# Patient Record
Sex: Female | Born: 1955 | Race: White | Hispanic: No | Marital: Single | Smoking: Never smoker
Health system: Southern US, Community
[De-identification: ages and names within clinical notes are randomized; demographics above are authoritative.]

## PROBLEM LIST (undated history)

## (undated) DIAGNOSIS — E119 Type 2 diabetes mellitus without complications: Secondary | ICD-10-CM

## (undated) DIAGNOSIS — K219 Gastro-esophageal reflux disease without esophagitis: Secondary | ICD-10-CM

## (undated) DIAGNOSIS — Z87442 Personal history of urinary calculi: Secondary | ICD-10-CM

## (undated) DIAGNOSIS — M1990 Unspecified osteoarthritis, unspecified site: Secondary | ICD-10-CM

## (undated) DIAGNOSIS — R011 Cardiac murmur, unspecified: Secondary | ICD-10-CM

## (undated) DIAGNOSIS — I10 Essential (primary) hypertension: Secondary | ICD-10-CM

## (undated) DIAGNOSIS — D649 Anemia, unspecified: Secondary | ICD-10-CM

## (undated) DIAGNOSIS — E7800 Pure hypercholesterolemia, unspecified: Secondary | ICD-10-CM

## (undated) HISTORY — DX: Pure hypercholesterolemia, unspecified: E78.00

## (undated) HISTORY — PX: TONSILLECTOMY: SUR1361

---

## 1999-05-15 HISTORY — PX: WISDOM TOOTH EXTRACTION: SHX21

## 2001-07-15 ENCOUNTER — Other Ambulatory Visit: Admission: RE | Admit: 2001-07-15 | Discharge: 2001-07-15 | Payer: Self-pay | Admitting: Obstetrics and Gynecology

## 2001-07-28 ENCOUNTER — Encounter: Payer: Self-pay | Admitting: Obstetrics and Gynecology

## 2001-07-28 ENCOUNTER — Ambulatory Visit (HOSPITAL_COMMUNITY): Admission: RE | Admit: 2001-07-28 | Discharge: 2001-07-28 | Payer: Self-pay | Admitting: Obstetrics and Gynecology

## 2003-12-08 ENCOUNTER — Ambulatory Visit (HOSPITAL_COMMUNITY): Admission: RE | Admit: 2003-12-08 | Discharge: 2003-12-08 | Payer: Self-pay | Admitting: Obstetrics and Gynecology

## 2004-10-19 ENCOUNTER — Emergency Department (HOSPITAL_COMMUNITY): Admission: EM | Admit: 2004-10-19 | Discharge: 2004-10-19 | Payer: Self-pay | Admitting: Emergency Medicine

## 2004-11-08 ENCOUNTER — Ambulatory Visit (HOSPITAL_COMMUNITY): Admission: RE | Admit: 2004-11-08 | Discharge: 2004-11-08 | Payer: Self-pay | Admitting: Urology

## 2005-07-02 ENCOUNTER — Ambulatory Visit (HOSPITAL_COMMUNITY): Admission: RE | Admit: 2005-07-02 | Discharge: 2005-07-02 | Payer: Self-pay | Admitting: Obstetrics and Gynecology

## 2006-07-22 ENCOUNTER — Ambulatory Visit (HOSPITAL_COMMUNITY): Admission: RE | Admit: 2006-07-22 | Discharge: 2006-07-22 | Payer: Self-pay | Admitting: Family Medicine

## 2006-07-30 ENCOUNTER — Ambulatory Visit (HOSPITAL_COMMUNITY): Admission: RE | Admit: 2006-07-30 | Discharge: 2006-07-30 | Payer: Self-pay | Admitting: Otolaryngology

## 2007-11-19 ENCOUNTER — Ambulatory Visit (HOSPITAL_COMMUNITY): Admission: RE | Admit: 2007-11-19 | Discharge: 2007-11-19 | Payer: Self-pay | Admitting: Obstetrics and Gynecology

## 2007-11-20 ENCOUNTER — Ambulatory Visit: Payer: Self-pay | Admitting: Internal Medicine

## 2007-11-20 DIAGNOSIS — Z8669 Personal history of other diseases of the nervous system and sense organs: Secondary | ICD-10-CM | POA: Insufficient documentation

## 2007-11-20 DIAGNOSIS — K219 Gastro-esophageal reflux disease without esophagitis: Secondary | ICD-10-CM | POA: Insufficient documentation

## 2007-11-20 DIAGNOSIS — J309 Allergic rhinitis, unspecified: Secondary | ICD-10-CM | POA: Insufficient documentation

## 2007-11-21 ENCOUNTER — Encounter (INDEPENDENT_AMBULATORY_CARE_PROVIDER_SITE_OTHER): Payer: Self-pay | Admitting: Internal Medicine

## 2007-11-25 ENCOUNTER — Ambulatory Visit: Payer: Self-pay | Admitting: Internal Medicine

## 2007-11-25 ENCOUNTER — Other Ambulatory Visit: Admission: RE | Admit: 2007-11-25 | Discharge: 2007-11-25 | Payer: Self-pay | Admitting: Obstetrics and Gynecology

## 2007-11-25 DIAGNOSIS — I10 Essential (primary) hypertension: Secondary | ICD-10-CM | POA: Insufficient documentation

## 2007-11-27 ENCOUNTER — Encounter (INDEPENDENT_AMBULATORY_CARE_PROVIDER_SITE_OTHER): Payer: Self-pay | Admitting: Internal Medicine

## 2007-12-01 ENCOUNTER — Ambulatory Visit (HOSPITAL_COMMUNITY): Admission: RE | Admit: 2007-12-01 | Discharge: 2007-12-01 | Payer: Self-pay | Admitting: Obstetrics and Gynecology

## 2007-12-20 ENCOUNTER — Encounter (INDEPENDENT_AMBULATORY_CARE_PROVIDER_SITE_OTHER): Payer: Self-pay | Admitting: Internal Medicine

## 2007-12-26 LAB — CONVERTED CEMR LAB
BUN: 19 mg/dL (ref 6–23)
Basophils Absolute: 0.1 10*3/uL (ref 0.0–0.1)
Basophils Relative: 1 % (ref 0–1)
CO2: 25 meq/L (ref 19–32)
Calcium: 9.2 mg/dL (ref 8.4–10.5)
Chloride: 102 meq/L (ref 96–112)
Cholesterol: 193 mg/dL (ref 0–200)
Creatinine, Ser: 0.7 mg/dL (ref 0.40–1.20)
Eosinophils Absolute: 0.1 10*3/uL (ref 0.0–0.7)
Eosinophils Relative: 2 % (ref 0–5)
Glucose, Bld: 159 mg/dL — ABNORMAL HIGH (ref 70–99)
HCT: 44.1 % (ref 36.0–46.0)
HDL: 53 mg/dL (ref >39)
Hemoglobin: 14.4 g/dL (ref 12.0–15.0)
LDL Cholesterol: 119 mg/dL — ABNORMAL HIGH (ref 0–99)
Lymphocytes Relative: 38 % (ref 12–46)
Lymphs Abs: 3 10*3/uL (ref 0.7–4.0)
MCHC: 32.7 g/dL (ref 30.0–36.0)
MCV: 91.1 fL (ref 78.0–100.0)
Monocytes Absolute: 0.6 10*3/uL (ref 0.1–1.0)
Monocytes Relative: 8 % (ref 3–12)
Neutro Abs: 4.2 10*3/uL (ref 1.7–7.7)
Neutrophils Relative %: 53 % (ref 43–77)
Platelets: 247 10*3/uL (ref 150–400)
Potassium: 4.3 meq/L (ref 3.5–5.3)
RBC: 4.84 M/uL (ref 3.87–5.11)
RDW: 12.6 % (ref 11.5–15.5)
Sodium: 138 meq/L (ref 135–145)
TSH: 2.132 microintl units/mL (ref 0.350–4.50)
Total CHOL/HDL Ratio: 3.6
Triglycerides: 105 mg/dL (ref <150)
VLDL: 21 mg/dL (ref 0–40)
WBC: 8 10*3/uL (ref 4.0–10.5)

## 2008-01-06 ENCOUNTER — Telehealth (INDEPENDENT_AMBULATORY_CARE_PROVIDER_SITE_OTHER): Payer: Self-pay | Admitting: Internal Medicine

## 2008-02-25 ENCOUNTER — Encounter (INDEPENDENT_AMBULATORY_CARE_PROVIDER_SITE_OTHER): Payer: Self-pay | Admitting: Internal Medicine

## 2008-05-14 HISTORY — PX: COLONOSCOPY: SHX174

## 2008-06-16 ENCOUNTER — Ambulatory Visit: Payer: Self-pay | Admitting: Internal Medicine

## 2008-06-17 ENCOUNTER — Encounter (INDEPENDENT_AMBULATORY_CARE_PROVIDER_SITE_OTHER): Payer: Self-pay | Admitting: Internal Medicine

## 2008-06-21 LAB — CONVERTED CEMR LAB
BUN: 14 mg/dL (ref 6–23)
CO2: 18 meq/L — ABNORMAL LOW (ref 19–32)
Calcium: 9.5 mg/dL (ref 8.4–10.5)
Chloride: 102 meq/L (ref 96–112)
Creatinine, Ser: 0.64 mg/dL (ref 0.40–1.20)
Glucose, Bld: 206 mg/dL — ABNORMAL HIGH (ref 70–99)
Potassium: 4.1 meq/L (ref 3.5–5.3)
Sodium: 138 meq/L (ref 135–145)

## 2008-06-25 ENCOUNTER — Ambulatory Visit: Payer: Self-pay | Admitting: Internal Medicine

## 2008-06-25 LAB — CONVERTED CEMR LAB
Blood Glucose, Fingerstick: 293
Hgb A1c MFr Bld: 10.3 %

## 2008-06-28 ENCOUNTER — Encounter (INDEPENDENT_AMBULATORY_CARE_PROVIDER_SITE_OTHER): Payer: Self-pay | Admitting: Internal Medicine

## 2008-08-07 ENCOUNTER — Emergency Department (HOSPITAL_COMMUNITY): Admission: EM | Admit: 2008-08-07 | Discharge: 2008-08-07 | Payer: Self-pay | Admitting: Emergency Medicine

## 2008-09-22 ENCOUNTER — Ambulatory Visit: Payer: Self-pay | Admitting: Internal Medicine

## 2008-09-22 LAB — CONVERTED CEMR LAB
Blood Glucose, Fingerstick: 106
Hgb A1c MFr Bld: 7.4 %

## 2008-09-23 ENCOUNTER — Encounter (INDEPENDENT_AMBULATORY_CARE_PROVIDER_SITE_OTHER): Payer: Self-pay | Admitting: Internal Medicine

## 2008-09-23 LAB — CONVERTED CEMR LAB
Creatinine, Urine: 174.2 mg/dL
Microalb Creat Ratio: 6.1 mg/g (ref 0.0–30.0)
Microalb, Ur: 1.06 mg/dL (ref 0.00–1.89)

## 2008-12-29 ENCOUNTER — Ambulatory Visit: Payer: Self-pay | Admitting: Internal Medicine

## 2008-12-29 DIAGNOSIS — E119 Type 2 diabetes mellitus without complications: Secondary | ICD-10-CM | POA: Insufficient documentation

## 2008-12-29 LAB — CONVERTED CEMR LAB: Hgb A1c MFr Bld: 6.7 %

## 2009-01-08 ENCOUNTER — Encounter (INDEPENDENT_AMBULATORY_CARE_PROVIDER_SITE_OTHER): Payer: Self-pay | Admitting: Internal Medicine

## 2009-01-11 ENCOUNTER — Encounter: Payer: Self-pay | Admitting: Internal Medicine

## 2009-01-12 LAB — CONVERTED CEMR LAB
ALT: 16 units/L (ref 0–35)
AST: 13 units/L (ref 0–37)
Albumin: 4.4 g/dL (ref 3.5–5.2)
Alkaline Phosphatase: 60 units/L (ref 39–117)
BUN: 14 mg/dL (ref 6–23)
CO2: 25 meq/L (ref 19–32)
Calcium: 9.3 mg/dL (ref 8.4–10.5)
Chloride: 102 meq/L (ref 96–112)
Cholesterol: 174 mg/dL (ref 0–200)
Creatinine, Ser: 0.64 mg/dL (ref 0.40–1.20)
Glucose, Bld: 137 mg/dL — ABNORMAL HIGH (ref 70–99)
HDL: 38 mg/dL — ABNORMAL LOW (ref >39)
LDL Cholesterol: 112 mg/dL — ABNORMAL HIGH (ref 0–99)
Potassium: 4.8 meq/L (ref 3.5–5.3)
Sodium: 138 meq/L (ref 135–145)
Total Bilirubin: 0.5 mg/dL (ref 0.3–1.2)
Total CHOL/HDL Ratio: 4.6
Total Protein: 6.9 g/dL (ref 6.0–8.3)
Triglycerides: 119 mg/dL (ref <150)
VLDL: 24 mg/dL (ref 0–40)

## 2009-01-13 ENCOUNTER — Telehealth (INDEPENDENT_AMBULATORY_CARE_PROVIDER_SITE_OTHER): Payer: Self-pay | Admitting: Internal Medicine

## 2009-01-26 ENCOUNTER — Ambulatory Visit (HOSPITAL_COMMUNITY): Admission: RE | Admit: 2009-01-26 | Discharge: 2009-01-26 | Payer: Self-pay | Admitting: Internal Medicine

## 2009-01-26 ENCOUNTER — Ambulatory Visit: Payer: Self-pay | Admitting: Internal Medicine

## 2009-01-26 DIAGNOSIS — M79609 Pain in unspecified limb: Secondary | ICD-10-CM | POA: Insufficient documentation

## 2009-02-21 ENCOUNTER — Ambulatory Visit (HOSPITAL_COMMUNITY): Admission: RE | Admit: 2009-02-21 | Discharge: 2009-02-21 | Payer: Self-pay | Admitting: Internal Medicine

## 2009-02-21 ENCOUNTER — Ambulatory Visit: Payer: Self-pay | Admitting: Internal Medicine

## 2009-02-21 ENCOUNTER — Encounter: Payer: Self-pay | Admitting: Internal Medicine

## 2009-02-24 ENCOUNTER — Ambulatory Visit (HOSPITAL_COMMUNITY): Admission: RE | Admit: 2009-02-24 | Discharge: 2009-02-24 | Payer: Self-pay | Admitting: Internal Medicine

## 2009-10-12 ENCOUNTER — Ambulatory Visit (HOSPITAL_COMMUNITY): Admission: RE | Admit: 2009-10-12 | Discharge: 2009-10-12 | Payer: Self-pay | Admitting: Internal Medicine

## 2010-06-13 NOTE — Letter (Signed)
Summary: Epworth Scale  Epworth Scale   Imported By: Lutricia Horsfall 11/27/2007 14:03:27  _____________________________________________________________________  External Attachment:    Type:   Image     Comment:   External Document

## 2010-06-13 NOTE — Assessment & Plan Note (Signed)
**Note De-Identified Mary Holden Obfuscation** Summary: LT GREAT TOE PAIN   Vital Signs:  Patient profile:   55 year old female Weight:      212.75 pounds O2 Sat:      96 % on Room air Pulse rate:   74 / minute Pulse rhythm:   regular BP sitting:   120 / 70  (right arm)  Vitals Entered By: Micah Flesher, LPN (January 26, 2009 3:13 PM) CC: L toe pain   CC:  L toe pain.  History of Present Illness: here with redness and pain in left toe.  She says the redness got better after the doxycycline but recurred and the clindamycin didn't help it much.  She has noticed increased pain and swelling.  Allergies: 1)  ! * Sulpha 2)  Codeine 3)  Phenergan  Past History:  Past Medical History: Reviewed history from 06/25/2008 and no changes required. Allergic rhinitis GERD carpal tunnel syndrome Hypertension NIDDM  Physical Exam  General:  Obese, in no acute distress. Alert and oriented X 3.  Extremities:  erythema with increased warmth left great toe medially from nail proximally to above PIP joint.  there is some swelling over the joint medially.     Impression & Recommendations:  Problem # 1:  TOE PAIN (ICD-729.5) The extent of this is much greater than before and does not appear like a paronychia, but possibly an inflammatory arthropathy.  Will start nabumetone two times a day and get x-ray for further evaluation.  Orders: Diagnostic X-Ray/Fluoroscopy (Diagnostic X-Ray/Flu)  Complete Medication List: 1)  Claritin 10 Mg Tabs (Loratadine) 2)  Adult Aspirin Low Strength 81 Mg Tbdp (Aspirin) 3)  Lisinopril 20 Mg Tabs (Lisinopril) .Marland Kitchen.. 1 by mouth once daily 4)  Pravastatin Sodium 20 Mg Tabs (Pravastatin sodium) .Marland Kitchen.. 1 by mouth once daily 5)  Nabumetone 750 Mg Tabs (Nabumetone) .Marland Kitchen.. 1 by mouth two times a day  Patient Instructions: 1)  You will be called with the results when they are available. Prescriptions: NABUMETONE 750 MG TABS (NABUMETONE) 1 by mouth two times a day  #60 x 0   Entered and Authorized by:    Erle Crocker MD   Signed by:   Erle Crocker MD on 01/26/2009   Method used:   Electronically to        [facility]  [address] (retail)       [address]       [place], [place]  [address]       Ph: [phone]       Fax: [phone]   RxID:   [id number]

## 2010-06-13 NOTE — Assessment & Plan Note (Signed)
Summary: HTN   Vital Signs:  Patient Profile:   55 Years Old Female Height:     63 inches O2 Sat:      96 % O2 treatment:    Room Air Pulse rate:   87 / minute Resp:     9 per minute BP sitting:   136 / 84  (right arm)  Vitals Entered By: Lutricia Horsfall LPN (June 16, 2008 3:41 PM)                 Chief Complaint:  med refills.  History of Present Illness: Here for routine follow up.  She says she has been tolerating the lisinopril and her blood pressure has been running in the 100s/70s.  She is without complaints and says she needs a refill of the lisinopril.    Current Allergies: ! * SULPHA CODEINE PHENERGAN  Past Medical History:    Reviewed history from 11/25/2007 and no changes required:       Allergic rhinitis       GERD       carpal tunnel syndrome       Hypertension      Physical Exam  General:     Obese, in no acute distress. Alert and oriented X 3.     Impression & Recommendations:  Problem # 1:  HYPERTENSION (ICD-401.9) Reports excellent blood pressure control.  Continue lisinopril.  BMP today.  Discussed with her the importance of follow up q 6 months as the lisinopril can affect her kidneys and potassium and we want to monitor the medication. Her updated medication list for this problem includes:    Lisinopril 20 Mg Tabs (Lisinopril) .Marland Kitchen... 1 by mouth once daily  Orders: T-Basic Metabolic Panel [phone]) Venipuncture ([address])   Complete Medication List: 1)  Triamcinolone Acetonide 0.1 % Crea (Triamcinolone acetonide) .... Apply to affected area two times a day 2)  Claritin 10 Mg Tabs (Loratadine) 3)  Adult Aspirin Low Strength 81 Mg Tbdp (Aspirin) 4)  Lisinopril 20 Mg Tabs (Lisinopril) .Marland Kitchen.. 1 by mouth once daily   Patient Instructions: 1)  The patient will be called with the results when they are available.   Prescriptions: LISINOPRIL 20 MG  TABS (LISINOPRIL) 1 by mouth once daily  #30 x 5   Entered and Authorized by:    Erle Crocker MD   Signed by:   Erle Crocker MD on 06/16/2008   Method used:   Electronically to        [facility] (retail)       [address]       [place], [place]  [address]       Ph: [phone]       Fax: [phone]   RxID:   [id number]

## 2010-06-13 NOTE — Letter (Signed)
Summary: Work [facility]  [address]   [place], [place] [address]   Phone: [phone]  Fax: [phone]    Today's Date: November 20, 2007  Name of Patient: Mary Holden  The above named patient had a medical visit today at:   8:30 am.  Please take this into consideration when reviewing the time away from work/school.    Special Instructions:  [ x ] None  [  ] To be off the remainder of today, returning to the normal work / school schedule tomorrow.  [  ] To be off until the next scheduled appointment on ______________________.  [  ] Other ________________________________________________________________ ________________________________________________________________________   Sincerely yours,   Erle Crocker MD

## 2010-06-13 NOTE — Assessment & Plan Note (Signed)
Summary: Establish care   Vital Signs:  Patient Profile:   55 Years Old Female Height:     63 inches Weight:      228.75 pounds O2 Sat:      96 % O2 treatment:    Room Air Temp:     99.6 degrees F tympanic Pulse rate:   92 / minute Resp:     10 per minute BP sitting:   162 / 92  (left arm)  Vitals Entered By: Lutricia Horsfall (November 25, 2007 2:02 PM)                 Chief Complaint:  new patient.  History of Present Illness: 55 year old woman here with SO to establish care.  She went fishing this past weekend and went to 3 different ponds and the same evening she noted small black spots on her ankles, L>R.  The next day she noted red, itchy spots, mostly around her ankles but also up her legs, L>R.  It all started 4 days ago and it has progressed and she has some lesions on her back, abdomen now.    She had been going to [facility] due to a cough.  SHe says she gets a cough about every year.  It had been a nagging, aggrevating cough, that had been so bad that she vomited at times.  She also started having trouble with her breathing last year when the cough started.  Both years the cough went away but her trouble breathing did not.  It occurs with walking real fast or if she walks up hill.  She doesn't call it shortness of breath but "short winded."  No chest pains.  Cough has been non productive.  She does snore and does have trouble with daytime fatigue.  Her SO says she does sound like she stops breathing at night sometimes.    She has an appointment with Dr. Emelda Fear this afternoon for her pap smear.  Her last mammogram was last week.  She has never had colon cancer screening.    Her blood pressures when she checks them at [facility] is 150/90s and has been for 1-2 years.  She had been on HCTZ at one time for her blood pressure and then her medication was changed to something that was too expensive so she stopped it.      Current Allergies: ! * SULPHA CODEINE PHENERGAN  Past  Medical History:    Allergic rhinitis    GERD    carpal tunnel syndrome    Hypertension   Family History:    father-deceased-69-heart, lung cancer    mother-deceased-58-DM, breast cancer, throat cancer    brother-deceased-60-throat cancer, [place] vet--PTSD, schizophrenia    Review of Systems  General      Denies chills, fever, and sweats.  Eyes      Denies blurring and double vision.  ENT      Complains of nasal congestion.      Denies earache and sore throat.  CV      Denies chest pain or discomfort and palpitations.  Resp      See HPI  GI      Denies constipation, diarrhea, and nausea.  GU      Denies dysuria and incontinence.  MS      Denies joint pain and low back pain.  Derm      See HPI  Neuro      Denies headaches, numbness, and seizures.  Psych  Denies anxiety and depression.   Physical Exam  General:     Obese, in no acute distress. Alert and oriented X 3.  Eyes:     EOMI, PERRL, sclera anicteric  Ears:     Tympanic membranes clear.  Ear canals without erythema.  Nose:     moderate Erythema and swelling with clear drainage.  Mouth:     No eyrthema or exudate.  Neck:     No lymphadenopathy, JVD, thyromegaly or carotid bruits.  Lungs:     Clear to auscultation bilaterally with good air movement, normal expansion.  Heart:     Normal S1 and S2. No murmurs or extracardiac sounds. PMI is nondisplaced.  Abdomen:     Soft, nontender, nondistended, + bowel sounds, no organomegaly.  Extremities:     No edema, calf tenderness or swelling.  Skin:     erythematous papules the coalesce on left ankle.  On legs there are multiple individual erythematous papules.  Ther are a few scattered papules on the trunk.   Additional Exam:     Epworth score: 13    Impression & Recommendations:  Problem # 1:  DYSPNEA (ICD-786.05) Spirometry normal.  I suspect this is multifactorial, with obesity, untreated HTN with possible LV strain,  possible sleep apnea, all contributing.  We are going to work on getting her blood pressure under better control and get a sleep study.  We discussed the need for weight loss.  We will check a CBC also.  If all of this does not result in a clear diagnosis and improvement in dyspnea, she will need a cardiac evaluation and may need to to peak flows at home.  Orders: T-CBC w/Diff ([id number]) Spirometry w/Graph ([address]) Split Night (Split Night)   Problem # 2:  OBESITY, MORBID (ICD-278.01) Discussed the need for weight loss especially given her family history of DM.  She is going to come back fasting for her labs, in at least 2 weeks when the steroid will be out of her system.  Orders: T-Basic Metabolic Panel [id number]) T-Lipid Profile [phone]) T-TSH ([phone])   Problem # 3:  HYPERTENSION (ICD-401.9) Her blood pressure needs to be treated and we are going to start a moderate dose of lisinopril once daily.  I have asked her to check her blood pressure 1-2 times per week for the next few weeks and we can see what the lisinipril is doing.   Micromedex handout given on ACE-I.   Her updated medication list for this problem includes:    Lisinopril 20 Mg Tabs (Lisinopril) .Marland Kitchen... 1 by mouth once daily  Orders: Split Night (Split Night)   Problem # 4:  INSECT BITE (ICD-919.4) The rash appears most like insect bites.   Orders: Dexamethasone Sodium Phosphate 1mg  (J1100) Depo- Medrol 80mg  (J1040) Admin of Therapeutic Inj  intramuscular or subcutaneous (28413)   Problem # 5:  Preventive Health Care (ICD-V70.0) We discussed options for colon cancer screening and she is going to consider them.    Complete Medication List: 1)  Triamcinolone Acetonide 0.1 % Crea (Triamcinolone acetonide) .... Apply to affected area two times a day 2)  Claritin 10 Mg Tabs (Loratadine) 3)  Adult Aspirin Low Strength 81 Mg Tbdp (Aspirin) 4)  Lisinopril 20 Mg Tabs (Lisinopril) .Marland Kitchen.. 1 by mouth once  daily  Spirometry completed per MD order.  Results to MD for review.  Sherrie Gardner  November 25, 2007 3:10 PM   Prescriptions: LISINOPRIL 20 MG  TABS (LISINOPRIL) 1 by mouth once  daily  #30 x 5   Entered and Authorized by:   Erle Crocker MD   Signed by:   Erle Crocker MD on 11/26/2007   Method used:   Faxed to ...       [facility] [address] [facility]       [address]       [place], [place]  [address]       Ph: [phone]       Fax: [phone]   RxID:   [id number]  ]  Medication Administration  Injection # 1:    Medication: Dexamethasone Sodium Phosphate 1mg     Diagnosis: INSECT BITE (ICD-919.4)    Route: IM    Site: R deltoid    Exp Date: 2/11    Lot #: [id number]    Mfr: [employer]ent    Comments: 4mg  IM per MD order    Patient tolerated injection without complications    Given by: Lutricia Horsfall (November 25, 2007 3:42 PM)  Injection # 2:    Medication: Depo- Medrol 80mg     Diagnosis: INSECT BITE (ICD-919.4)    Route: IM    Site: R deltoid    Exp Date: 8/11    Lot #: [id number]    Mfr: [employer]    Patient tolerated injection without complications    Given by: Lutricia Horsfall (November 25, 2007 3:42 PM)  Orders Added: 1)  T-CBC w/Diff [[id number]] 2)  T-Basic Metabolic Panel [id number] 3)  T-Lipid Profile [[address]-63016] 4)  T-TSH [[id number]] 5)  Dexamethasone Sodium Phosphate 1mg  [J1100] 6)  Depo- Medrol 80mg  [J1040] 7)  Admin of Therapeutic Inj  intramuscular or subcutaneous [[address]] 8)  Est. Patient Level V [99215] 9)  Spirometry w/Graph [94010] 10)  Split Night [Split Night]     Pulmonary Function Test Date: 11/25/2007 Height (in.): 63 Gender: Female  Pre-Spirometry FVC    Value: 3.45 L/min   Pred: 3.00 L/min     % Pred: 115 % FEV1    Value: 2.86 L     Pred: 2.46 L     % Pred: 116 % FEV1/FVC  Value: 83 %     Pred: 82 %     % Pred: 101 % FEF 25-75  Value: 3.53 L/min   Pred: 2.73 L/min     % Pred: 129 %  Appended Document: Establish  care Referral for sleep study faxed.  Appended Document: Establish care Appt scheduled for 01/09/08.

## 2010-06-13 NOTE — Progress Notes (Signed)
Summary: Sleep Study Cancelled  Phone Note From Other Clinic   Summary of Call: LInda at [facility] Scheduling at [facility] states that pt called and advised her that a friend will be having a kidney transplant and that she will not be able to have sleep study as scheduled.  States that she will call back to reschedule. Initial call taken by: Lutricia Horsfall,  January 06, 2008 10:32 AM  Follow-up for Phone Call        Noted Follow-up by: Erle Crocker MD,  January 06, 2008 10:53 AM

## 2010-06-13 NOTE — Letter (Signed)
Summary: New patient H and P  New patient H and P   Imported By: Donneta Romberg 11/21/2007 17:05:35  _____________________________________________________________________  External Attachment:    Type:   Image     Comment:   External Document

## 2010-06-13 NOTE — Letter (Signed)
Summary: Work [facility]  [address]   [place], [place] [address]   Phone: [phone]  Fax: [phone]    Today's Date: November 25, 2007  Name of Patient: Mary Holden  The above named patient had a medical visit today at:  1330 pm.  Pt was in our office until 1540.  Please take this into consideration when reviewing the time away from work/school.    Special Instructions:  [ X ] None  [  ] To be off the remainder of today, returning to the normal work / school schedule tomorrow.  [  ] To be off until the next scheduled appointment on ______________________.  [  ] Other ________________________________________________________________ ________________________________________________________________________   Sincerely yours,   Lutricia Horsfall, LPN

## 2010-06-13 NOTE — Letter (Signed)
Summary: [facility] Yearly pap and physical  [facility] pap [facility] physical   Imported By: Curtis Sites 12/03/2007 16:01:31  _____________________________________________________________________  External Attachment:    Type:   Image     Comment:   External Document

## 2010-06-13 NOTE — Assessment & Plan Note (Signed)
Summary: DM/HTN   Vital Signs:  Patient profile:   55 year old female Weight:      219 pounds BMI:     38.93 O2 Sat:      99 % Pulse rate:   96 / minute BP sitting:   122 / 74  (right arm)  Vitals Entered By: Micah Flesher, LPN (Sep 22, 2008 4:02 PM)  Nutrition Counseling: Patient's BMI is greater than 25 and therefore counseled on weight management options. CC: DM F/U Is Patient Diabetic? Yes  CBG Result 106   CC:  DM F/U.  History of Present Illness: Patient presents today for routine follow up with her partner.  She has been working on diet and exercise but pulled the abductor muscles in her left leg and was out of commission for about 1 month from end of March to April.  Current Medications (verified): 1)  Claritin 10 Mg  Tabs (Loratadine) 2)  Adult Aspirin Low Strength 81 Mg  Tbdp (Aspirin) 3)  Lisinopril 20 Mg  Tabs (Lisinopril) .Marland Kitchen.. 1 By Mouth Once Daily  Allergies: 1)  ! * Sulpha 2)  Codeine 3)  Phenergan  Past History:  Past Medical History:    Reviewed history from 06/25/2008 and no changes required:    Allergic rhinitis    GERD    carpal tunnel syndrome    Hypertension    NIDDM  Physical Exam  General:  Obese, in no acute distress. Alert and oriented X 3.    Impression & Recommendations:  Problem # 1:  DIABETES MELLITUS, TYPE II, UNCONTROLLED (ICD-250.02) In 3 months she has lost 10 pounds and brought her HbA1c down 3%.  Although not at goal, she has made excellent progress and she will continue with her lifestyle modifications.  Urine was obtained today for a urinary microalbumin-creatinine ratio.  Her updated medication list for this problem includes:    Adult Aspirin Low Strength 81 Mg Tbdp (Aspirin)    Lisinopril 20 Mg Tabs (Lisinopril) .Marland Kitchen... 1 by mouth once daily  Orders: Capillary Blood Glucose/CBG (82948) Hemoglobin A1C (83036) T-Urine Microalbumin w/creat. ratio ([address] / [address])  Problem # 2:  HYPERTENSION (ICD-401.9) BP  excellent. Her updated medication list for this problem includes:    Lisinopril 20 Mg Tabs (Lisinopril) .Marland Kitchen... 1 by mouth once daily  Complete Medication List: 1)  Claritin 10 Mg Tabs (Loratadine) 2)  Adult Aspirin Low Strength 81 Mg Tbdp (Aspirin) 3)  Lisinopril 20 Mg Tabs (Lisinopril) .Marland Kitchen.. 1 by mouth once daily  Patient Instructions: 1)  Please schedule a follow-up appointment in 3 months.  Laboratory Results   Blood Tests   Date/Time Received: Sep 22, 2008 4:09 PM   HGBA1C: 7.4%   (Normal Range: Non-Diabetic - 3-6%   Control Diabetic - 6-8%) CBG Random:: 106mg /dL

## 2010-06-13 NOTE — Assessment & Plan Note (Signed)
Summary: DM/HTN/paronychia/mammogram & c-scope referral   Vital Signs:  Patient profile:   55 year old female Height:      63 inches Weight:      210 pounds O2 Sat:      97 % Pulse rate:   96 / minute BP sitting:   128 / 80  (left arm) Cuff size:   large  Vitals Entered By: Carlye Grippe (December 29, 2008 3:50 PM) CC: follow-up visit DM Is Patient Diabetic? Yes   CC:  follow-up visit DM.  History of Present Illness: Patient presents today for routine follow up with her partner.  They have modified their diet and she says she doesn't feel like she is doing without anything.  She is without complaints except for pain and redness of her left great toe for a few days.    Her last mammogram was in July of 2009 as was her last pap smear.  She has never had colon cancer screening.  Allergies: 1)  ! * Sulpha 2)  Codeine 3)  Phenergan  Past History:  Past Medical History: Reviewed history from 06/25/2008 and no changes required. Allergic rhinitis GERD carpal tunnel syndrome Hypertension NIDDM  Past Surgical History: Reviewed history from 11/20/2007 and no changes required. Denies surgical history  Family History: father-deceased-69-heart, lung cancer mother-deceased-58-DM, breast cancer, throat cancer brother-deceased-61-throat cancer, [place] vet--PTSD, schizophrenia  Social History: Reviewed history from 06/25/2008 and no changes required. Divorced lives with partner Never Smoked Alcohol use-yes-0-2 drinks, monthly or less Drug use-no  Review of Systems      See HPI  Physical Exam  General:  Obese, in no acute distress. Alert and oriented X 3.  Extremities:  erythema with increased warmth to left medial great toe around nail.   Impression & Recommendations:  Problem # 1:  DIABETES MELLITUS, CONTROLLED, WITHOUT COMPLICATIONS (ICD-250.00) She has done a great job with diet and weight loss and her HbA1c is perfect.  She will return for fasting labs.   Her  updated medication list for this problem includes:    Adult Aspirin Low Strength 81 Mg Tbdp (Aspirin)    Lisinopril 20 Mg Tabs (Lisinopril) .Marland Kitchen... 1 by mouth once daily  Problem # 2:  HYPERTENSION (ICD-401.9) BP looks good and she is due for labs. Her updated medication list for this problem includes:    Lisinopril 20 Mg Tabs (Lisinopril) .Marland Kitchen... 1 by mouth once daily  Problem # 3:  PARONYCHIA, LEFT GREAT TOE (ICD-681.11)  Her updated medication list for this problem includes:    Doxycycline Hyclate 100 Mg Tabs (Doxycycline hyclate) .Marland Kitchen... 1 by mouth two times a day  Problem # 4:  UNSPECIFIED BREAST SCREENING (ICD-V76.10) Mammogram scheduled 26 January 2009 Orders: Mammogram (Mammogram)  Problem # 5:  SCREENING, COLON CANCER (ICD-V76.51)  Discussed options for colon cancer screening and she is agreeable to referral for colonoscopyl  Orders: Gastroenterology Referral (GI)  Complete Medication List: 1)  Claritin 10 Mg Tabs (Loratadine) 2)  Adult Aspirin Low Strength 81 Mg Tbdp (Aspirin) 3)  Lisinopril 20 Mg Tabs (Lisinopril) .Marland Kitchen.. 1 by mouth once daily 4)  Doxycycline Hyclate 100 Mg Tabs (Doxycycline hyclate) .Marland Kitchen.. 1 by mouth two times a day  Other Orders: Hemoglobin A1C (87564) T-Comprehensive Metabolic Panel ([address]-18841) T-Lipid Profile (66063-01601)  Patient Instructions: 1)  You will be called with the results when they are available. Prescriptions: DOXYCYCLINE HYCLATE 100 MG TABS (DOXYCYCLINE HYCLATE) 1 by mouth two times a day  #14 x 0   Entered and  Authorized by:   Erle Crocker MD   Signed by:   Erle Crocker MD on 12/29/2008   Method used:   Electronically to        [facility] [address] (retail)       [address]       [place], [place]  [address]       Ph: [phone]       Fax: [phone]   RxID:   [id number] LISINOPRIL 20 MG  TABS (LISINOPRIL) 1 by mouth once daily  #30 Each x 5   Entered and Authorized by:   Erle Crocker MD    Signed by:   Erle Crocker MD on 12/29/2008   Method used:   Electronically to        [facility]   [address] [facility]ail[facility]       [address]       [place], [place]  [address]       Ph: [phone]       Fax: [phone]   RxID:   [id number]   Laboratory Results   Blood Tests   Date/Time Recieved: December 29, 2008 4:04 PM  Date/Time Reported: December 29, 2008 4:04 PM   HGBA1C: 6.7%   (Normal Range: Non-Diabetic - 3-6%   Control Diabetic - 6-8%)

## 2010-06-13 NOTE — Assessment & Plan Note (Signed)
Summary: contact dermatitis   Vital Signs:  Patient Profile:   55 Years Old Female O2 Sat:      94 % O2 treatment:    Room Air Pulse rate:   79 / minute Resp:     9 per minute BP sitting:   152 / 104  (right arm)  Vitals Entered By: Lutricia Horsfall (November 20, 2007 8:29 AM)                 Chief Complaint:  ?shingles.  History of Present Illness: 5 days of rash that is tender and a little itchy on her right posterior shoulder.  She did have chicken pox as a child. She has applied hydrocortisone 1% cream on one occasion but it didn't really help.  She thought it was a bug bite initially but she saw the nurse at work who told her it might be shingles because there was some   Has appointment to establish care next week.     Current Allergies: ! * SULPHA CODEINE PHENERGAN  Past Medical History:    Allergic rhinitis    ? HTN    GERD    carpal tunnel syndrome  Past Surgical History:    Denies surgical history   Family History:    father-deceased-69-heart, lung cancer    mother-deceased-58-DM, breast cancer, throat cancer  Social History:    Divorced lives with SO    Never Smoked    Alcohol use-yes-0-2 drinks, monthly or less    Drug use-no   Risk Factors:  Tobacco use:  never Drug use:  no Alcohol use:  yes    Physical Exam  General:     Obese, in no acute distress. Alert and oriented X 3.  Skin:     erythematous plaque, 10 cm x 7 cm, with scan tiny vesicles.      Impression & Recommendations:  Problem # 1:  CONTACT DERMATITIS (ICD-692.9) This looks more like a allergic skin reaction than shingles.  We will try topical steroids and see what it looks like next week. Her updated medication list for this problem includes:    Triamcinolone Acetonide 0.1 % Crea (Triamcinolone acetonide) .Marland Kitchen... Apply to affected area two times a day    Claritin 10 Mg Tabs (Loratadine)   Complete Medication List: 1)  Triamcinolone Acetonide 0.1 % Crea (Triamcinolone  acetonide) .... Apply to affected area two times a day 2)  Claritin 10 Mg Tabs (Loratadine) 3)  Adult Aspirin Low Strength 81 Mg Tbdp (Aspirin)  PAP Screening:  PAP Smear Results:    Date of Exam:  10/27/2006    Results:  Normal  Next PAP Due:    10/26/2008  Mammogram Screening:  Mammogram Results:    Date of Exam:  11/19/2007    Results:  Normal Bilateral  Next Mammogram Due:    11/18/2008  Osteoporosis Risk Assessment:  Risk Factors for Fracture or Low Bone Density:   Race (White or Asian):     yes   Smoking status:       never    Prescriptions: TRIAMCINOLONE ACETONIDE 0.1 %  CREA (TRIAMCINOLONE ACETONIDE) apply to affected area two times a day  #45 grams x 0   Entered and Authorized by:   Erle Crocker MD   Signed by:   Erle Crocker MD on 11/20/2007   Method used:   Electronically sent to ...       [facility]       [address]       [place], [place]  [address]       Ph: [phone]       Fax: [phone]   RxID:   [id number]  ]

## 2010-06-13 NOTE — Letter (Signed)
Summary: sleep study   sleep study   Imported By: Curtis Sites 02/26/2008 10:53:28  _____________________________________________________________________  External Attachment:    Type:   Image     Comment:   External Document

## 2010-06-13 NOTE — Letter (Signed)
Summary: TCS ORDER  TCS ORDER   Imported By: Ave Filter 01/11/2009 08:22:22  _____________________________________________________________________  External Attachment:    Type:   Image     Comment:   External Document  Appended Document: TCS ORDER Pt called to let me know that she had only one addition to her triage from 8/10. She is now taking Pravastatin Sodium 20mg  once daily

## 2010-06-13 NOTE — Letter (Signed)
Summary: Diabetic Foot Exam  Diabetic Foot Exam   Imported By: Lutricia Horsfall LPN 84/16/6063 01:60:10  _____________________________________________________________________  External Attachment:    Type:   Image     Comment:   External Document

## 2010-06-13 NOTE — Procedures (Signed)
Summary: Spirometry  Spirometry   Imported By: Lutricia Horsfall 11/27/2007 14:03:54  _____________________________________________________________________  External Attachment:    Type:   Image     Comment:   External Document

## 2010-06-13 NOTE — Progress Notes (Signed)
Summary: please advise  Phone Note Call from Patient   Summary of Call: SO called in and said that Mary Holden toe got better briefly but has since started looking bad again, what should she do about this and she also states that she would like to take the pravastatin. if calling before 4:45 call her at work [phone].  Initial call taken by: Curtis Sites,  January 13, 2009 11:41 AM  Follow-up for Phone Call        Rx for pravastatin 20mg  sent to pharmacy along with different antibiotic, clindamycin, to take for 10 days.  Nurse to call Follow-up by: Erle Crocker MD,  January 13, 2009 3:17 PM    New/Updated Medications: CLINDAMYCIN HCL 300 MG CAPS (CLINDAMYCIN HCL) 1 by mouth three times a day for 10 days PRAVASTATIN SODIUM 20 MG TABS (PRAVASTATIN SODIUM) 1 by mouth once daily Prescriptions: PRAVASTATIN SODIUM 20 MG TABS (PRAVASTATIN SODIUM) 1 by mouth once daily  #30 x 2   Entered and Authorized by:   Erle Crocker MD   Signed by:   Erle Crocker MD on 01/13/2009   Method used:   Electronically to        [facility]       [address]       [place], [place]  [address]       Ph: [phone]       Fax: [phone]   RxID:   [id number] CLINDAMYCIN HCL 300 MG CAPS (CLINDAMYCIN HCL) 1 by mouth three times a day for 10 days  #30 x 0   Entered and Authorized by:   Erle Crocker MD   Signed by:   Erle Crocker MD on 01/13/2009   Method used:   Electronically to        [facility] [facility]       [address]       [place], [place]  [address]       Ph: [phone]       Fax: [phone]   RxID:   [id number]   Appended Document: please advise Called pt, advised of new scripts.

## 2010-06-13 NOTE — Assessment & Plan Note (Signed)
Summary: New onset DM   Vital Signs:  Patient Profile:   55 Years Old Female Height:     63 inches O2 Sat:      97 % O2 treatment:    Room Air Pulse rate:   93 / minute Resp:     10 per minute BP sitting:   150 / 84  (left arm)  Vitals Entered By: Lutricia Horsfall LPN (June 25, 2008 3:58 PM)             CBG Result 293     Chief Complaint:  DM.  History of Present Illness: Here with SO to discuss blood sugar.  She is aware of what the elevated blood sugar means because her mother had diabetes.  She says she is a starch eater and likes sweets.  Her partner does the cooking and on days when she works, they eat out because she works 12 hour shifts.  They eat a lot of [place] and [place] food.  Her last eye exam was 2 years ago.    Current Allergies: ! * SULPHA CODEINE PHENERGAN  Past Medical History:    Allergic rhinitis    GERD    carpal tunnel syndrome    Hypertension    NIDDM   Social History:    Divorced lives with partner    Never Smoked    Alcohol use-yes-0-2 drinks, monthly or less    Drug use-no     Physical Exam  General:     Obese, in no acute distress. Alert and oriented X 3.   Diabetes Management Exam:    Foot Exam (with socks and/or shoes not present):       Sensory-Monofilament:          Left foot: normal          Right foot: normal       Sensory-other: see scanned sheet       Inspection:          Left foot: abnormal             Comments: onycholysis of great toe nail    Impression & Recommendations:  Problem # 1:  DIABETES MELLITUS, TYPE II, UNCONTROLLED (ICD-250.02) Handout on basic information about diabetes given to patient.  We discussed insulin resistance and the need for weight loss.  We discussed dietary modification and portion control and she was given the one meal at a time and healthy eating handouts.  We discussed exercise and walking 16109 steps per day and she was provided with a pedometer.  Information on the diabetes  education class at [facility] given to patient and SO to attend.  She is willing to try diet and exercise for 3-6 months to try and get her sugar under control.  I suspect that she will end up on some medication, but she certainly should be given the opportunity to bring her sugar down.  I discussed with her the importance of a yearly eye exam and she says she is going to schedule one.  We discussed routine foot care.  Her updated medication list for this problem includes:    Adult Aspirin Low Strength 81 Mg Tbdp (Aspirin)    Lisinopril 20 Mg Tabs (Lisinopril) .Marland Kitchen... 1 by mouth once daily  Orders: Capillary Blood Glucose (82948) Hemoglobin A1C (83036) Fingerstick (60454)   Complete Medication List: 1)  Triamcinolone Acetonide 0.1 % Crea (Triamcinolone acetonide) .... Apply to affected area two times a day 2)  Claritin 10 Mg Tabs (  Loratadine) 3)  Adult Aspirin Low Strength 81 Mg Tbdp (Aspirin) 4)  Lisinopril 20 Mg Tabs (Lisinopril) .Marland Kitchen.. 1 by mouth once daily   Patient Instructions: 1)  Over 20 minutes was spent with the patient and SO, most of it in counseling.   2)  Please schedule a follow-up appointment in 3 months.    Laboratory Results   Blood Tests   Date/Time Received: June 25, 2008 4:02 PM   HGBA1C: 10.3%   (Normal Range: Non-Diabetic - 3-6%   Control Diabetic - 6-8%) CBG Random:: 293mg /dL      Laboratory Results   Blood Tests     HGBA1C: 10.3%   (Normal Range: Non-Diabetic - 3-6%   Control Diabetic - 6-8%) CBG Random: 293

## 2010-12-18 ENCOUNTER — Other Ambulatory Visit: Payer: Self-pay | Admitting: Obstetrics and Gynecology

## 2010-12-18 DIAGNOSIS — R920 Mammographic microcalcification found on diagnostic imaging of breast: Secondary | ICD-10-CM

## 2010-12-27 ENCOUNTER — Ambulatory Visit (HOSPITAL_COMMUNITY)
Admission: RE | Admit: 2010-12-27 | Discharge: 2010-12-27 | Disposition: A | Discharge status: Home / Self Care | Payer: Self-pay | Source: Ambulatory Visit | Attending: Obstetrics and Gynecology | Admitting: Obstetrics and Gynecology

## 2010-12-27 DIAGNOSIS — R920 Mammographic microcalcification found on diagnostic imaging of breast: Secondary | ICD-10-CM

## 2013-04-02 ENCOUNTER — Other Ambulatory Visit (HOSPITAL_COMMUNITY): Payer: Self-pay | Admitting: Internal Medicine

## 2013-04-02 DIAGNOSIS — N200 Calculus of kidney: Secondary | ICD-10-CM

## 2013-04-06 ENCOUNTER — Ambulatory Visit (HOSPITAL_COMMUNITY)
Admission: RE | Admit: 2013-04-06 | Discharge: 2013-04-06 | Disposition: A | Discharge status: Home / Self Care | Payer: 59 | Source: Ambulatory Visit | Attending: Internal Medicine | Admitting: Internal Medicine

## 2013-04-06 DIAGNOSIS — R109 Unspecified abdominal pain: Secondary | ICD-10-CM | POA: Insufficient documentation

## 2013-04-06 DIAGNOSIS — N200 Calculus of kidney: Secondary | ICD-10-CM

## 2013-04-06 DIAGNOSIS — K7689 Other specified diseases of liver: Secondary | ICD-10-CM | POA: Insufficient documentation

## 2013-04-06 DIAGNOSIS — R310 Gross hematuria: Secondary | ICD-10-CM | POA: Insufficient documentation

## 2013-04-06 DIAGNOSIS — K8020 Calculus of gallbladder without cholecystitis without obstruction: Secondary | ICD-10-CM | POA: Insufficient documentation

## 2013-05-14 DIAGNOSIS — M1990 Unspecified osteoarthritis, unspecified site: Secondary | ICD-10-CM

## 2013-05-14 HISTORY — DX: Unspecified osteoarthritis, unspecified site: M19.90

## 2013-06-16 ENCOUNTER — Other Ambulatory Visit: Payer: Self-pay | Admitting: Urology

## 2013-06-17 ENCOUNTER — Encounter (HOSPITAL_COMMUNITY): Payer: Self-pay

## 2013-06-18 ENCOUNTER — Encounter (HOSPITAL_COMMUNITY): Payer: Self-pay | Admitting: Pharmacy Technician

## 2013-06-22 ENCOUNTER — Encounter (HOSPITAL_COMMUNITY)
Admission: RE | Disposition: A | Discharge status: Home / Self Care | Payer: Self-pay | Source: Ambulatory Visit | Attending: Urology

## 2013-06-22 ENCOUNTER — Ambulatory Visit (HOSPITAL_COMMUNITY)
Admission: RE | Admit: 2013-06-22 | Discharge: 2013-06-22 | Disposition: A | Discharge status: Home / Self Care | Payer: BC Managed Care – PPO | Source: Ambulatory Visit | Attending: Urology | Admitting: Urology

## 2013-06-22 ENCOUNTER — Ambulatory Visit (HOSPITAL_COMMUNITY): Payer: BC Managed Care – PPO

## 2013-06-22 ENCOUNTER — Encounter (HOSPITAL_COMMUNITY): Payer: Self-pay | Admitting: *Deleted

## 2013-06-22 DIAGNOSIS — E119 Type 2 diabetes mellitus without complications: Secondary | ICD-10-CM | POA: Insufficient documentation

## 2013-06-22 DIAGNOSIS — N200 Calculus of kidney: Secondary | ICD-10-CM | POA: Insufficient documentation

## 2013-06-22 DIAGNOSIS — K219 Gastro-esophageal reflux disease without esophagitis: Secondary | ICD-10-CM | POA: Insufficient documentation

## 2013-06-22 DIAGNOSIS — I10 Essential (primary) hypertension: Secondary | ICD-10-CM | POA: Insufficient documentation

## 2013-06-22 DIAGNOSIS — E669 Obesity, unspecified: Secondary | ICD-10-CM | POA: Insufficient documentation

## 2013-06-22 HISTORY — DX: Type 2 diabetes mellitus without complications: E11.9

## 2013-06-22 HISTORY — DX: Cardiac murmur, unspecified: R01.1

## 2013-06-22 HISTORY — DX: Essential (primary) hypertension: I10

## 2013-06-22 HISTORY — DX: Gastro-esophageal reflux disease without esophagitis: K21.9

## 2013-06-22 LAB — GLUCOSE, CAPILLARY: Glucose-Capillary: 135 mg/dL — ABNORMAL HIGH (ref 70–99)

## 2013-06-22 SURGERY — LITHOTRIPSY, ESWL
Anesthesia: LOCAL | Laterality: Left

## 2013-06-22 MED ORDER — DEXTROSE-NACL 5-0.45 % IV SOLN
INTRAVENOUS | Status: DC
  Administered 2013-06-22: 1000 mL via INTRAVENOUS

## 2013-06-22 MED ORDER — DIPHENHYDRAMINE HCL 25 MG PO CAPS
25.0000 mg | ORAL_CAPSULE | ORAL | Status: AC
  Administered 2013-06-22: 25 mg via ORAL
  Filled 2013-06-22: qty 1

## 2013-06-22 MED ORDER — DIAZEPAM 5 MG PO TABS
10.0000 mg | ORAL_TABLET | ORAL | Status: AC
  Administered 2013-06-22: 10 mg via ORAL
  Filled 2013-06-22: qty 2

## 2013-06-22 MED ORDER — OXYCODONE-ACETAMINOPHEN 5-325 MG PO TABS: 1.0000 | ORAL_TABLET | ORAL | Status: DC | PRN

## 2013-06-22 MED ORDER — TRAMADOL HCL 50 MG PO TABS
50.0000 mg | ORAL_TABLET | Freq: Once | ORAL | Status: AC
  Administered 2013-06-22: 50 mg via ORAL
  Filled 2013-06-22: qty 2

## 2013-06-22 MED ORDER — TRAMADOL HCL 50 MG PO TABS: 100.0000 mg | ORAL_TABLET | Freq: Four times a day (QID) | ORAL | Status: DC | PRN

## 2013-06-22 MED ORDER — CIPROFLOXACIN HCL 500 MG PO TABS
500.0000 mg | ORAL_TABLET | ORAL | Status: AC
Start: 2013-06-22 — End: 2013-06-22
  Administered 2013-06-22: 500 mg via ORAL
  Filled 2013-06-22: qty 1

## 2013-06-22 MED ORDER — TRAMADOL HCL 50 MG PO TABS
100.0000 mg | ORAL_TABLET | Freq: Once | ORAL | Status: DC
  Filled 2013-06-22: qty 2

## 2013-06-22 NOTE — H&P (Signed)
Urology History and Physical Exam  CC: Left nephrolithiasis.  HPI:  58 year old female presents today for a left renal stone.  She has a history of kidney stones.  She is unable to pass them on her own previously.  She had onset of pain in October 2014.  This is been intermittent.  She had a CT scan 04/06/13 which revealed stones.  These were bilateral in nature.  She has a stone in her left renal pelvis.  This was 0.9 cm in size.  It measured 745 Hounsfield units.  She has ongoing intermittent flank pain.  She had a KUB in clinic 06/12/12 which revealed the stone was visible in the left renal pelvis.  We discussed the possibility first pain being caused by intermittent obstruction from the stone.  We discussed various management options including observation, shockwave lithotripsy, ureteroscopy, and PCNL.  We discussed risks, benefits, alternatives, and likelihood of achieving goals.  She presents today for shockwave lithotripsy of the left renal pelvic stone.  UA 06/12/13 was negative for signs of infection.  She has no risk of being pregnant. She continues to have intermittent flank pain.  I reviewed the images that she had today for KUB.  The left renal pelvic stone was clearly visible and can be targeted easily for shockwave lithotripsy today. We discussed that stone could be in the lower pole, but given that it is still causing pain, it is likely in the renal pelvis.  PMH: Past Medical History  Diagnosis Date  . GERD (gastroesophageal reflux disease)   . Diabetes mellitus without complication   . Hypertension   . Heart murmur     PSH: Past Surgical History  Procedure Laterality Date  . Tonsillectomy  age 72  . Wisdom tooth extraction  2001    Allergies: Allergies  Allergen Reactions  . Codeine     REACTION: nausea  . Promethazine Hcl     REACTION: sweats, chills    Medications: No prescriptions prior to admission     Social History: History   Social History  . Marital  Status: Single    Spouse Name: N/A    Number of Children: N/A  . Years of Education: N/A   Occupational History  . Not on file.   Social History Main Topics  . Smoking status: Never Smoker   . Smokeless tobacco: Not on file  . Alcohol Use: Yes     Comment: occassionally beer  . Drug Use: No  . Sexual Activity: Not on file   Other Topics Concern  . Not on file   Social History Narrative  . No narrative on file    Family History: History reviewed. No pertinent family history.  Review of Systems: Positive: Left flank pain. Negative: Fever, SOB, or chest pain.  A further 10 point review of systems was negative except what is listed in the HPI.  Physical Exam: Filed Vitals:   06/22/13 1249  BP: 143/61  Pulse: 79  Temp: 97.8 F (36.6 C)  Resp: 18    General: No acute distress.  Awake. Head:  Normocephalic.  Atraumatic. ENT:  EOMI.  Mucous membranes moist Neck:  Supple.  No lymphadenopathy. CV:  S1 present. S2 present. Regular rate. Pulmonary: Equal effort bilaterally.  Clear to auscultation bilaterally. Abdomen: Soft.  Non- tender to palpation. Skin:  Normal turgor.  No visible rash. Extremity: No gross deformity of bilateral upper extremities.  No gross deformity of    bilateral lower extremities. Neurologic: Alert. Appropriate mood.  Studies:  No results found for this basename: HGB, WBC, PLT,  in the last 72 hours  No results found for this basename: NA, K, CL, CO2, BUN, CREATININE, CALCIUM, MAGNESIUM, GFRNONAA, GFRAA,  in the last 72 hours   No results found for this basename: PT, INR, APTT,  in the last 72 hours   No components found with this basename: ABG,     Assessment:  Left nephrolithiasis  Plan: Proceed as planned for left renal shockwave lithotripsy.

## 2013-06-22 NOTE — Discharge Instructions (Signed)
DISCHARGE INSTRUCTIONS FOR SHOCKWAVE LITHOTRIPSY ° °MEDICATIONS:  ° °1. DO NOT RESUME YOUR ASPIRIN, or any other medicines like ibuprofen, motrin, excedrin, advil, aleve, vitamin E, fish oil as these can all cause bleeding x 7 days. ° °2. Resume all your other meds from. ° °ACTIVITY °1. No strenuous activity x 1week °2. No driving while on narcotic pain medications °3. Drink plenty of water °4. Continue to walk at home - you can still get blood clots when you are at home, so keep active, but don't over do it. °5. May return to work in 1-3 days. ° °BATHING °You can shower or take a bath. ° ° °SIGNS/SYMPTOMS TO CALL: °1. Please call us if you have a fever greater than 101.5, uncontrolled  °nausea/vomiting, uncontrolled pain, dizziness, unable to urinate, chest pain, shortness of breath, leg swelling, leg pain, redness around wound, drainage from wound, or any other concerns or questions. ° °You can reach us at [phone]. ° ° ° °

## 2013-06-22 NOTE — Op Note (Signed)
Urology Operative Report  Date of Procedure: 06/22/13  Surgeon: Natalia Leatherwoodaniel Caroline Longie, MD Assistant:  None  Preoperative Diagnosis: Left renal pelvis stone. Postoperative Diagnosis:   Same  Procedure(s): Left renal shockwave lithotripsy  Estimated blood loss: None  Specimen: None  Drains: None  Complications: None  Findings: Left renal pelvic stone.  History of present illness: Patient presents for symptomatic left renal stone.  Pre-procedure films were reviewed to identify the stone.   Procedure in detail: After informed consent was obtained, the patient was taken to the SWL truck. They were placed in the supine position. Correct surgical site was marked. IV antibiotics were infused, and IV sedation was induced. A timeout was performed in which the correct patient, surgical site, and procedure were identified and agreed upon by the team.  The machine was coupled to the patient and fluoroscopy was used to align the stone. Lithotripsy was carried out in the standard fashion by starting at a low intensity level of shocks and progressing up to a level 4. A total of 4000 shocks was used while monitoring the stone to ensure it remained in good positon.  The stone appeared to fragment well on fluoroscopy during the treatment.  We used a Art therapisteimens Lithostar Modularis lithotriptor.  After all the planned shocks were delivered, the patient was awakened and taken to the recovery area in a stable position.

## 2013-06-23 LAB — GLUCOSE, CAPILLARY: Glucose-Capillary: 213 mg/dL — ABNORMAL HIGH (ref 70–99)

## 2013-06-25 ENCOUNTER — Emergency Department (HOSPITAL_COMMUNITY)
Admission: EM | Admit: 2013-06-25 | Discharge: 2013-06-25 | Disposition: A | Discharge status: Home / Self Care | Payer: BC Managed Care – PPO | Attending: Emergency Medicine | Admitting: Emergency Medicine

## 2013-06-25 ENCOUNTER — Encounter (HOSPITAL_COMMUNITY): Payer: Self-pay | Admitting: Emergency Medicine

## 2013-06-25 DIAGNOSIS — R011 Cardiac murmur, unspecified: Secondary | ICD-10-CM | POA: Insufficient documentation

## 2013-06-25 DIAGNOSIS — E119 Type 2 diabetes mellitus without complications: Secondary | ICD-10-CM | POA: Insufficient documentation

## 2013-06-25 DIAGNOSIS — Z9889 Other specified postprocedural states: Secondary | ICD-10-CM | POA: Insufficient documentation

## 2013-06-25 DIAGNOSIS — Z87442 Personal history of urinary calculi: Secondary | ICD-10-CM | POA: Insufficient documentation

## 2013-06-25 DIAGNOSIS — K219 Gastro-esophageal reflux disease without esophagitis: Secondary | ICD-10-CM | POA: Insufficient documentation

## 2013-06-25 DIAGNOSIS — N23 Unspecified renal colic: Secondary | ICD-10-CM | POA: Insufficient documentation

## 2013-06-25 DIAGNOSIS — I10 Essential (primary) hypertension: Secondary | ICD-10-CM | POA: Insufficient documentation

## 2013-06-25 DIAGNOSIS — Z79899 Other long term (current) drug therapy: Secondary | ICD-10-CM | POA: Insufficient documentation

## 2013-06-25 LAB — URINALYSIS, ROUTINE W REFLEX MICROSCOPIC
Bilirubin Urine: NEGATIVE
Glucose, UA: 100 mg/dL — AB
Ketones, ur: 15 mg/dL — AB
Leukocytes, UA: NEGATIVE
Nitrite: NEGATIVE
Protein, ur: NEGATIVE mg/dL
Specific Gravity, Urine: >1.03 — ABNORMAL HIGH (ref 1.005–1.030)
Urobilinogen, UA: 0.2 mg/dL (ref 0.0–1.0)
pH: 5.5 (ref 5.0–8.0)

## 2013-06-25 LAB — URINE MICROSCOPIC-ADD ON

## 2013-06-25 MED ORDER — MORPHINE SULFATE 4 MG/ML IJ SOLN
4.0000 mg | Freq: Once | INTRAMUSCULAR | Status: AC
  Administered 2013-06-25: 4 mg via INTRAVENOUS
  Filled 2013-06-25: qty 1

## 2013-06-25 MED ORDER — ONDANSETRON HCL 4 MG/2ML IJ SOLN
4.0000 mg | Freq: Once | INTRAMUSCULAR | Status: AC
  Administered 2013-06-25: 4 mg via INTRAVENOUS
  Filled 2013-06-25: qty 2

## 2013-06-25 MED ORDER — ONDANSETRON HCL 4 MG PO TABS: 4.0000 mg | ORAL_TABLET | Freq: Four times a day (QID) | ORAL | Status: DC

## 2013-06-25 MED ORDER — KETOROLAC TROMETHAMINE 30 MG/ML IJ SOLN
30.0000 mg | Freq: Once | INTRAMUSCULAR | Status: DC
  Filled 2013-06-25: qty 1

## 2013-06-25 NOTE — Discharge Instructions (Signed)
Take Zofran 20 minutes prior to taking your pain medication.  Return to the emergency department if you develop severe pain, high fever, vomiting, or other new or concerning symptoms.   Kidney Stones Kidney stones (urolithiasis) are deposits that form inside your kidneys. The intense pain is caused by the stone moving through the urinary tract. When the stone moves, the ureter goes into spasm around the stone. The stone is usually passed in the urine.  CAUSES   A disorder that makes certain neck glands produce too much parathyroid hormone (primary hyperparathyroidism).  A buildup of uric acid crystals, similar to gout in your joints.  Narrowing (stricture) of the ureter.  A kidney obstruction present at birth (congenital obstruction).  Previous surgery on the kidney or ureters.  Numerous kidney infections. SYMPTOMS   Feeling sick to your stomach (nauseous).  Throwing up (vomiting).  Blood in the urine (hematuria).  Pain that usually spreads (radiates) to the groin.  Frequency or urgency of urination. DIAGNOSIS   Taking a history and physical exam.  Blood or urine tests.  CT scan.  Occasionally, an examination of the inside of the urinary bladder (cystoscopy) is performed. TREATMENT   Observation.  Increasing your fluid intake.  Extracorporeal shock wave lithotripsy This is a noninvasive procedure that uses shock waves to break up kidney stones.  Surgery may be needed if you have severe pain or persistent obstruction. There are various surgical procedures. Most of the procedures are performed with the use of small instruments. Only small incisions are needed to accommodate these instruments, so recovery time is minimized. The size, location, and chemical composition are all important variables that will determine the proper choice of action for you. Talk to your health care provider to better understand your situation so that you will minimize the risk of injury to  yourself and your kidney.  HOME CARE INSTRUCTIONS   Drink enough water and fluids to keep your urine clear or pale yellow. This will help you to pass the stone or stone fragments.  Strain all urine through the provided strainer. Keep all particulate matter and stones for your health care provider to see. The stone causing the pain may be as small as a grain of salt. It is very important to use the strainer each and every time you pass your urine. The collection of your stone will allow your health care provider to analyze it and verify that a stone has actually passed. The stone analysis will often identify what you can do to reduce the incidence of recurrences.  Only take over-the-counter or prescription medicines for pain, discomfort, or fever as directed by your health care provider.  Make a follow-up appointment with your health care provider as directed.  Get follow-up X-rays if required. The absence of pain does not always mean that the stone has passed. It may have only stopped moving. If the urine remains completely obstructed, it can cause loss of kidney function or even complete destruction of the kidney. It is your responsibility to make sure X-rays and follow-ups are completed. Ultrasounds of the kidney can show blockages and the status of the kidney. Ultrasounds are not associated with any radiation and can be performed easily in a matter of minutes. SEEK MEDICAL CARE IF:  You experience pain that is progressive and unresponsive to any pain medicine you have been prescribed. SEEK IMMEDIATE MEDICAL CARE IF:   Pain cannot be controlled with the prescribed medicine.  You have a fever or shaking chills.  The severity or intensity of pain increases over 18 hours and is not relieved by pain medicine.  You develop a new onset of abdominal pain.  You feel faint or pass out.  You are unable to urinate. MAKE SURE YOU:   Understand these instructions.  Will watch your  condition.  Will get help right away if you are not doing well or get worse. Document Released: 04/30/2005 Document Revised: 12/31/2012 Document Reviewed: 10/01/2012 [facility] Patient Information 2014 [place], [place].  Ureteral Colic (Kidney Stones) Ureteral colic is the result of a condition when kidney stones form inside the kidney. Once kidney stones are formed they may move into the tube that connects the kidney with the bladder (ureter). If this occurs, this condition may cause pain (colic) in the ureter.  CAUSES  Pain is caused by stone movement in the ureter and the obstruction caused by the stone. SYMPTOMS  The pain comes and goes as the ureter contracts around the stone. The pain is usually intense, sharp, and stabbing in character. The location of the pain may move as the stone moves through the ureter. When the stone is near the kidney the pain is usually located in the back and radiates to the belly (abdomen). When the stone is ready to pass into the bladder the pain is often located in the lower abdomen on the side the stone is located. At this location, the symptoms may mimic those of a urinary tract infection with urinary frequency. Once the stone is located here it often passes into the bladder and the pain disappears completely. TREATMENT   Your caregiver will provide you with medicine for pain relief.  You may require specialized follow-up X-rays.  The absence of pain does not always mean that the stone has passed. It may have just stopped moving. If the urine remains completely obstructed, it can cause loss of kidney function or even complete destruction of the involved kidney. It is your responsibility and in your interest that X-rays and follow-ups as suggested by your caregiver are completed. Relief of pain without passage of the stone can be associated with severe damage to the kidney, including loss of kidney function on that side.  If your stone does not pass on its own,  additional measures may be taken by your caregiver to ensure its removal. HOME CARE INSTRUCTIONS   Increase your fluid intake. Water is the preferred fluid since juices containing vitamin C may acidify the urine making it less likely for certain stones (uric acid stones) to pass.  Strain all urine. A strainer will be provided. Keep all particulate matter or stones for your caregiver to inspect.  Take your pain medicine as directed.  Make a follow-up appointment with your caregiver as directed.  Remember that the goal is passage of your stone. The absence of pain does not mean the stone is gone. Follow your caregiver's instructions.  Only take over-the-counter or prescription medicines for pain, discomfort, or fever as directed by your caregiver. SEEK MEDICAL CARE IF:   Pain cannot be controlled with the prescribed medicine.  You have a fever.  Pain continues for longer than your caregiver advises it should.  There is a change in the pain, and you develop chest discomfort or constant abdominal pain.  You feel faint or pass out. MAKE SURE YOU:   Understand these instructions.  Will watch your condition.  Will get help right away if you are not doing well or get worse. Document Released: 02/07/2005 Document Revised:  08/25/2012 Document Reviewed: 10/25/2010 [employer] Patient Information 2014 [place], [place].

## 2013-06-25 NOTE — ED Notes (Signed)
Patient complaining of left flank pain radiating into groin area starting approximately 2000 tonight. States she had surgery Monday for kidney stone.

## 2013-06-25 NOTE — ED Provider Notes (Signed)
CSN: [id number]     Arrival date & time 06/25/13  0212 History   First MD Initiated Contact with Patient 06/25/13 0227     Chief Complaint  Patient presents with  . Flank Pain     (Consider location/radiation/quality/duration/timing/severity/associated sxs/prior Treatment) HPI Comments: Patient is a 58 year old female who presents with complaints of severe left flank pain that started at approximately 8PM.  She had a lithotripsy performed by Dr. Margarita Grizzle from [facility] three days ago.  She denies fever, blood in the urine, or bowel complaints.  This feels like her prior kidney stone pain.  She had no relief with 1/2 of a percocet prior to arrival.  Patient is a 58 y.o. female presenting with flank pain. The history is provided by the patient.  Flank Pain This is a recurrent problem. The current episode started 3 to 5 hours ago. The problem occurs constantly. The problem has been gradually worsening. Nothing aggravates the symptoms. Nothing relieves the symptoms. She has tried nothing for the symptoms. The treatment provided no relief.    Past Medical History  Diagnosis Date  . GERD (gastroesophageal reflux disease)   . Diabetes mellitus without complication   . Hypertension   . Heart murmur   . Kidney stone    Past Surgical History  Procedure Laterality Date  . Tonsillectomy  age 24  . Wisdom tooth extraction  2001   History reviewed. No pertinent family history. History  Substance Use Topics  . Smoking status: Never Smoker   . Smokeless tobacco: Not on file  . Alcohol Use: Yes     Comment: occassionally beer   OB History   Grav Para Term Preterm Abortions TAB SAB Ect Mult Living                 Review of Systems  Genitourinary: Positive for flank pain.  All other systems reviewed and are negative.      Allergies  Codeine; Promethazine hcl; and Sulfa antibiotics  Home Medications   Current Outpatient Rx  Name  Route  Sig  Dispense  Refill  . lisinopril  (PRINIVIL,ZESTRIL) 20 MG tablet   Oral   Take 20 mg by mouth every morning.         . loratadine (CLARITIN) 10 MG tablet   Oral   Take 10 mg by mouth daily.         . metFORMIN (GLUCOPHAGE) 500 MG tablet   Oral   Take 500 mg by mouth 2 (two) times daily with a meal.         . oxyCODONE-acetaminophen (PERCOCET) 5-325 MG per tablet   Oral   Take 1-2 tablets by mouth every 4 (four) hours as needed.   1 tablet   0   . pravastatin (PRAVACHOL) 40 MG tablet   Oral   Take 40 mg by mouth every evening.         . ranitidine (ZANTAC) 150 MG tablet   Oral   Take 150 mg by mouth 2 (two) times daily.         . traMADol (ULTRAM) 50 MG tablet   Oral   Take 50 mg by mouth every 6 (six) hours as needed for moderate pain or severe pain.         . traMADol (ULTRAM) 50 MG tablet   Oral   Take 2 tablets (100 mg total) by mouth every 6 (six) hours as needed.   1 tablet   0  BP 168/83  Pulse 78  Temp(Src) 97.7 F (36.5 C) (Oral)  Resp 20  Ht 5\' 2"  (1.575 m)  Wt 210 lb (95.255 kg)  BMI 38.40 kg/m2  SpO2 95% Physical Exam  Nursing note and vitals reviewed. Constitutional: She is oriented to person, place, and time. She appears well-developed and well-nourished. No distress.  HENT:  Head: Normocephalic and atraumatic.  Neck: Normal range of motion. Neck supple.  Cardiovascular: Normal rate and regular rhythm.  Exam reveals no gallop and no friction rub.   No murmur heard. Pulmonary/Chest: Effort normal and breath sounds normal. No respiratory distress. She has no wheezes.  Abdominal: Soft. Bowel sounds are normal. She exhibits no distension and no mass. There is tenderness. There is no rebound and no guarding.  There is tenderness to palpation in the left lower quadrant.  Musculoskeletal: Normal range of motion. She exhibits no edema.  Neurological: She is alert and oriented to person, place, and time.  Skin: Skin is warm and dry. She is not diaphoretic.    ED Course   Procedures (including critical care time) Labs Review Labs Reviewed  URINALYSIS, ROUTINE W REFLEX MICROSCOPIC   Imaging Review No results found.    MDM   Final diagnoses:  None    Patient is a 58 year old female who presents with left flank pain 3 days status post lithotripsy. It appears as though she has a stone fragment that is causing a recurrent renal colic. Urinalysis reveals no infection, only blood and the patient appears nontoxic. She is given Toradol, Zofran, and morphine and is feeling much better. At this point I feel as though she is stable for discharge. I do not feel as though repeat imaging is indicated. To return as needed for high fever, severe pain, or other problems.    Geoffery Lyonsouglas Damany Eastman, MD 06/25/13 [phone]

## 2013-11-27 ENCOUNTER — Other Ambulatory Visit (HOSPITAL_COMMUNITY): Payer: Self-pay | Admitting: Internal Medicine

## 2013-11-27 DIAGNOSIS — Z1231 Encounter for screening mammogram for malignant neoplasm of breast: Secondary | ICD-10-CM

## 2013-12-02 ENCOUNTER — Ambulatory Visit (HOSPITAL_COMMUNITY)
Admission: RE | Admit: 2013-12-02 | Discharge: 2013-12-02 | Disposition: A | Discharge status: Home / Self Care | Payer: BC Managed Care – PPO | Source: Ambulatory Visit | Attending: Internal Medicine | Admitting: Internal Medicine

## 2013-12-02 DIAGNOSIS — Z1231 Encounter for screening mammogram for malignant neoplasm of breast: Secondary | ICD-10-CM

## 2015-02-10 ENCOUNTER — Other Ambulatory Visit (HOSPITAL_COMMUNITY): Payer: Self-pay | Admitting: Internal Medicine

## 2015-02-10 DIAGNOSIS — Z1231 Encounter for screening mammogram for malignant neoplasm of breast: Secondary | ICD-10-CM

## 2015-02-15 ENCOUNTER — Other Ambulatory Visit: Payer: Self-pay | Admitting: Internal Medicine

## 2015-02-15 DIAGNOSIS — N6310 Unspecified lump in the right breast, unspecified quadrant: Secondary | ICD-10-CM

## 2015-02-22 ENCOUNTER — Ambulatory Visit
Admission: RE | Admit: 2015-02-22 | Discharge: 2015-02-22 | Disposition: A | Discharge status: Home / Self Care | Payer: BLUE CROSS/BLUE SHIELD | Source: Ambulatory Visit | Attending: Internal Medicine | Admitting: Internal Medicine

## 2015-02-22 DIAGNOSIS — N6310 Unspecified lump in the right breast, unspecified quadrant: Secondary | ICD-10-CM

## 2015-03-16 ENCOUNTER — Ambulatory Visit (HOSPITAL_COMMUNITY): Payer: Managed Care, Other (non HMO)

## 2015-04-22 ENCOUNTER — Other Ambulatory Visit (HOSPITAL_COMMUNITY): Payer: Self-pay | Admitting: Internal Medicine

## 2015-04-22 DIAGNOSIS — M8580 Other specified disorders of bone density and structure, unspecified site: Secondary | ICD-10-CM

## 2015-04-28 ENCOUNTER — Other Ambulatory Visit (HOSPITAL_COMMUNITY): Payer: BLUE CROSS/BLUE SHIELD

## 2015-05-15 HISTORY — PX: BREAST BIOPSY: SHX20

## 2015-09-12 ENCOUNTER — Other Ambulatory Visit: Payer: Self-pay | Admitting: Internal Medicine

## 2015-09-12 DIAGNOSIS — N63 Unspecified lump in unspecified breast: Secondary | ICD-10-CM

## 2015-09-23 ENCOUNTER — Ambulatory Visit
Admission: RE | Admit: 2015-09-23 | Discharge: 2015-09-23 | Disposition: A | Discharge status: Home / Self Care | Payer: BLUE CROSS/BLUE SHIELD | Source: Ambulatory Visit | Attending: Internal Medicine | Admitting: Internal Medicine

## 2015-09-23 ENCOUNTER — Other Ambulatory Visit: Payer: Self-pay | Admitting: Internal Medicine

## 2015-09-23 DIAGNOSIS — N63 Unspecified lump in unspecified breast: Secondary | ICD-10-CM

## 2015-09-23 DIAGNOSIS — R921 Mammographic calcification found on diagnostic imaging of breast: Secondary | ICD-10-CM

## 2015-09-29 ENCOUNTER — Ambulatory Visit
Admission: RE | Admit: 2015-09-29 | Discharge: 2015-09-29 | Disposition: A | Discharge status: Home / Self Care | Payer: BLUE CROSS/BLUE SHIELD | Source: Ambulatory Visit | Attending: Internal Medicine | Admitting: Internal Medicine

## 2015-09-29 ENCOUNTER — Other Ambulatory Visit: Payer: Self-pay | Admitting: Internal Medicine

## 2015-09-29 DIAGNOSIS — R921 Mammographic calcification found on diagnostic imaging of breast: Secondary | ICD-10-CM

## 2015-09-30 ENCOUNTER — Other Ambulatory Visit: Payer: Self-pay | Admitting: Internal Medicine

## 2015-09-30 DIAGNOSIS — R921 Mammographic calcification found on diagnostic imaging of breast: Secondary | ICD-10-CM

## 2015-10-04 ENCOUNTER — Ambulatory Visit
Admission: RE | Admit: 2015-10-04 | Discharge: 2015-10-04 | Disposition: A | Discharge status: Home / Self Care | Payer: BLUE CROSS/BLUE SHIELD | Source: Ambulatory Visit | Attending: Internal Medicine | Admitting: Internal Medicine

## 2015-10-04 DIAGNOSIS — R921 Mammographic calcification found on diagnostic imaging of breast: Secondary | ICD-10-CM

## 2015-10-21 ENCOUNTER — Other Ambulatory Visit: Payer: Self-pay | Admitting: Surgery

## 2015-10-21 DIAGNOSIS — N6091 Unspecified benign mammary dysplasia of right breast: Secondary | ICD-10-CM

## 2015-11-09 ENCOUNTER — Other Ambulatory Visit: Payer: Self-pay | Admitting: Surgery

## 2015-11-09 DIAGNOSIS — N6091 Unspecified benign mammary dysplasia of right breast: Secondary | ICD-10-CM

## 2015-11-23 ENCOUNTER — Encounter (HOSPITAL_COMMUNITY): Payer: Self-pay

## 2015-11-23 ENCOUNTER — Encounter (HOSPITAL_COMMUNITY)
Admission: RE | Admit: 2015-11-23 | Discharge: 2015-11-23 | Disposition: A | Discharge status: Home / Self Care | Payer: BLUE CROSS/BLUE SHIELD | Source: Ambulatory Visit | Attending: Surgery | Admitting: Surgery

## 2015-11-23 DIAGNOSIS — Z87442 Personal history of urinary calculi: Secondary | ICD-10-CM | POA: Diagnosis not present

## 2015-11-23 DIAGNOSIS — Z803 Family history of malignant neoplasm of breast: Secondary | ICD-10-CM | POA: Diagnosis not present

## 2015-11-23 DIAGNOSIS — K219 Gastro-esophageal reflux disease without esophagitis: Secondary | ICD-10-CM | POA: Diagnosis not present

## 2015-11-23 DIAGNOSIS — I10 Essential (primary) hypertension: Secondary | ICD-10-CM | POA: Diagnosis not present

## 2015-11-23 DIAGNOSIS — N62 Hypertrophy of breast: Secondary | ICD-10-CM | POA: Diagnosis present

## 2015-11-23 DIAGNOSIS — Z6837 Body mass index (BMI) 37.0-37.9, adult: Secondary | ICD-10-CM | POA: Diagnosis not present

## 2015-11-23 DIAGNOSIS — E119 Type 2 diabetes mellitus without complications: Secondary | ICD-10-CM | POA: Diagnosis not present

## 2015-11-23 DIAGNOSIS — E7800 Pure hypercholesterolemia, unspecified: Secondary | ICD-10-CM | POA: Diagnosis not present

## 2015-11-23 HISTORY — DX: Unspecified osteoarthritis, unspecified site: M19.90

## 2015-11-23 LAB — CBC
HCT: 39 % (ref 36.0–46.0)
Hemoglobin: 12.6 g/dL (ref 12.0–15.0)
MCH: 28.1 pg (ref 26.0–34.0)
MCHC: 32.3 g/dL (ref 30.0–36.0)
MCV: 87.1 fL (ref 78.0–100.0)
Platelets: 224 10*3/uL (ref 150–400)
RBC: 4.48 MIL/uL (ref 3.87–5.11)
RDW: 12.6 % (ref 11.5–15.5)
WBC: 8.5 10*3/uL (ref 4.0–10.5)

## 2015-11-23 LAB — BASIC METABOLIC PANEL
Anion gap: 8 (ref 5–15)
BUN: 19 mg/dL (ref 6–20)
CO2: 23 mmol/L (ref 22–32)
Calcium: 9.4 mg/dL (ref 8.9–10.3)
Chloride: 109 mmol/L (ref 101–111)
Creatinine, Ser: 0.67 mg/dL (ref 0.44–1.00)
GFR calc Af Amer: >60 mL/min (ref >60)
GFR calc non Af Amer: >60 mL/min (ref >60)
Glucose, Bld: 97 mg/dL (ref 65–99)
Potassium: 4.2 mmol/L (ref 3.5–5.1)
Sodium: 140 mmol/L (ref 135–145)

## 2015-11-23 LAB — GLUCOSE, CAPILLARY: Glucose-Capillary: 106 mg/dL — ABNORMAL HIGH (ref 65–99)

## 2015-11-23 NOTE — Pre-Procedure Instructions (Signed)
Mary Holden  11/23/2015      [id number] - [place], [place] - [address] AT [facility] [address] [place]  [address] Phone: [phone] Fax: [phone]    Your procedure is scheduled on 11/25/2015   Report to [facility] Admitting at 5:30 A.M.   Call this number if you have problems the morning of surgery:   [phone]   Remember:  Do not eat food or drink liquids after midnight.  On Thursday    Take these medicines the morning of surgery with A SIP OF WATER : Claritin & Zantac   Do not wear jewelry, make-up or nail polish.   Do not wear lotions, powders, or perfumes.  You may     NOT    wear deoderant    Do not shave 48 hours prior to surgery.    Do not bring valuables to the hospital.   [facility] is not responsible for any belongings or valuables.  Contacts, dentures or bridgework may not be worn into surgery.  Leave your suitcase in the car.  After surgery it may be brought to your room.  For patients admitted to the hospital, discharge time will be determined by your treatment team.  Patients discharged the day of surgery will not be allowed to drive home.   Name and phone number of your driver:   /w friend   Special instructions:    How to Manage Your Diabetes Before and After Surgery  Why is it important to control my blood sugar before and after surgery? . Improving blood sugar levels before and after surgery helps healing and can limit problems. . A way of improving blood sugar control is eating a healthy diet by: o  Eating less sugar and carbohydrates o  Increasing activity/exercise o  Talking with your doctor about reaching your blood sugar goals . High blood sugars (greater than 180 mg/dL) can raise your risk of infections and slow your recovery, so you will need to focus on controlling your diabetes during the weeks before surgery. . Make sure that the doctor who takes care of your diabetes knows about  your planned surgery including the date and location.  How do I manage my blood sugar before surgery? . Check your blood sugar at least 4 times a day, starting 2 days before surgery, to make sure that the level is not too high or low. o Check your blood sugar the morning of your surgery when you wake up and every 2 hours until you get to the Short Stay unit. . If your blood sugar is less than 70 mg/dL, you will need to treat for low blood sugar: o Do not take insulin. o Treat a low blood sugar (less than 70 mg/dL) with  cup of clear juice (cranberry or apple), 4 glucose tablets, OR glucose gel. o Recheck blood sugar in 15 minutes after treatment (to make sure it is greater than 70 mg/dL). If your blood sugar is not greater than 70 mg/dL on recheck, call [phone] for further instructions. . Report your blood sugar to the short stay nurse when you get to Short Stay.  . If you are admitted to the hospital after surgery: o Your blood sugar will be checked by the staff and you will probably be given insulin after surgery (instead of oral diabetes medicines) to make sure you have good blood sugar levels. o The goal for blood sugar control after surgery is 80-180  mg/dL.              WHAT DO I DO ABOUT MY DIABETES MEDICATION?   Marland Kitchen. Do not take oral diabetes medicines (pills) the morning of surgery.  Special Instructions: [facility] - Preparing for Surgery    Before surgery, you can play an important role.  Because skin is not sterile, your skin needs to be as free of germs as possible.  You can reduce the number of germs on you skin by washing with CHG (chlorahexidine gluconate) soap before surgery.  CHG is an antiseptic cleaner which kills germs and bonds with the skin to continue killing germs even after washing.  Please DO NOT use if you have an allergy to CHG or antibacterial soaps.  If your skin becomes reddened/irritated stop using the CHG and inform your nurse when you arrive  at Short Stay.  Do not shave (including legs and underarms) for at least 48 hours prior to the first CHG shower.  You may shave your face.  Please follow these instructions carefully:   1.  Shower with CHG Soap the night before surgery and the  morning of Surgery.  2.  If you choose to wash your hair, wash your hair first as usual with your  normal shampoo.  3.  After you shampoo, rinse your hair and body thoroughly to remove the  Shampoo.  4.  Use CHG as you would any other liquid soap.  You can apply chg directly to the skin and wash gently with scrungie or a clean washcloth.  5.  Apply the CHG Soap to your body ONLY FROM THE NECK DOWN.    Do not use on open wounds or open sores.  Avoid contact with your eyes, ears, mouth and genitals (private parts).  Wash genitals (private parts)   with your normal soap.  6.  Wash thoroughly, paying special attention to the area where your surgery will be performed.  7.  Thoroughly rinse your body with warm water from the neck down.  8.  DO NOT shower/wash with your normal soap after using and rinsing off   the CHG Soap.  9.  Pat yourself dry with a clean towel.            10.  Wear clean pajamas.            11.  Place clean sheets on your bed the night of your first shower and do not sleep with pets.  Day of Surgery  . Do not apply any lotions/deodorants the morning of surgery.  Please wear clean clothes to the hospital/surgery center.       .   .   Other Instructions:          Patient Signature:  Date:   Nurse Signature:  Date:   Reviewed and Endorsed by [facility] Patient Education Committee, August 2015  Please read over the following fact sheets that you were given. Pain Booklet, Coughing and Deep Breathing and Surgical Site Infection Prevention

## 2015-11-23 NOTE — Progress Notes (Signed)
   11/23/15 0956  OBSTRUCTIVE SLEEP APNEA  Have you ever been diagnosed with sleep apnea through a sleep study? No  Do you snore loudly (loud enough to be heard through closed doors)?  1  Do you often feel tired, fatigued, or sleepy during the daytime (such as falling asleep during driving or talking to someone)? 0  Has anyone observed you stop breathing during your sleep? 0  Do you have, or are you being treated for high blood pressure? 1  BMI more than 35 kg/m2? 1  Age > 50 (1-yes) 1  Neck circumference greater than:Female 16 inches or larger, Female 17inches or larger? 1  Female Gender (Yes=1) 0  Obstructive Sleep Apnea Score 5  Score 5 or greater  Results sent to PCP

## 2015-11-24 ENCOUNTER — Ambulatory Visit
Admission: RE | Admit: 2015-11-24 | Discharge: 2015-11-24 | Disposition: A | Discharge status: Home / Self Care | Payer: BLUE CROSS/BLUE SHIELD | Source: Ambulatory Visit | Attending: Surgery | Admitting: Surgery

## 2015-11-24 DIAGNOSIS — N6091 Unspecified benign mammary dysplasia of right breast: Secondary | ICD-10-CM

## 2015-11-24 MED ORDER — CEFAZOLIN SODIUM-DEXTROSE 2-4 GM/100ML-% IV SOLN
2.0000 g | INTRAVENOUS | Status: AC
Start: 2015-11-25 — End: 2015-11-25
  Administered 2015-11-25: 2 g via INTRAVENOUS
  Filled 2015-11-24: qty 100

## 2015-11-24 NOTE — H&P (Signed)
Mary SierrasSheila L. Mary Holden  [facility] Patient #: [id number] DOB: 05-May-1956 Single / Language: Undefined / Race: White Female  History of Present Illness   Patient words: breast.   The patient is a 60 year old female who presents with a complaint of ALH of right breast.   Her PCP is Dr. Hughie ClossZ Hall.  She saw Dr. Norris CrossS. Turner for radiology.  She is accompanied with a friend, Mary Holden.  [facility] which showed three groups of calcifications within the right breast with similar morphology. These may represent early fibroadenomatoid changes.  She had a biopsy of the right breast - 09/29/2015 ([id number]) which showed ALH and a fibroadenoma. She had a second set of biopsies on 10/04/2015 which showed hyalinized fibroadenomas x 2. She is referred for [facility]. She's had no prior breast biopsies. Her mother had breast cancer in her 6840s, but died of other diseases. Her last period was about 15 years ago. She is not on hormone replacement.  I went over with her her path reports and gave her copies of that information. I discussed the surgery - seed loc lumpectomy and the risks. The risks include bleeding, infection, nerve injury, and the possibility of needing more surgery.  Past Medical History: 1. HTN x 5 years 2. Colonoscopy - 2011 3. History of kidney stones  I think that she saw Dr. Vernie Ammonsttelin 4. DM, type 2 x 10 years. 5. Morbid obesity - BMI - 38.45  Social History: Not married. No children. She is accompanied with a friend, Mary Holden. Her nephew lives with her. 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   Other Problems (Ammie Eversole, LPN; 9/1/47826/01/2016 9:569:28 AM) Arthritis Diabetes Mellitus Gastroesophageal Reflux Disease High blood pressure Hypercholesterolemia Kidney Stone  Past Surgical History (Ammie Eversole, LPN; 2/1/30866/01/2016  5:789:28 AM) Breast Biopsy Right. Oral Surgery Tonsillectomy  Diagnostic Studies History (Ammie Eversole, LPN; 4/6/96296/01/2016 5:289:28 AM) Colonoscopy 5-10 years ago Mammogram within last year Pap Smear >5 years ago  Allergies (Ammie Eversole, LPN; 4/1/32446/01/2016 0:109:29 AM) Codeine and Related  Medication History (Ammie Eversole, LPN; 2/7/25366/01/2016 6:449:29 AM) Janumet (50-1000MG  Tablet, Oral) Active. Lisinopril (20MG  Tablet, Oral) Active. Pravastatin Sodium (20MG  Tablet, Oral) Active. Zantac (150MG  Tablet, Oral) Active. Claritin (10MG  Capsule, Oral) Active. Medications Reconciled  Social History (Ammie Eversole, LPN; 0/3/47426/01/2016 5:959:28 AM) Alcohol use Occasional alcohol use. Caffeine use Coffee. No drug use Tobacco use Never smoker.  Family History (Ammie Eversole, LPN; 6/3/87566/01/2016 4:339:28 AM) Breast Cancer Mother. Diabetes Mellitus Mother.  Pregnancy / Birth History Deon Pilling(Ammie Eversole, LPN; 2/9/51886/01/2016 4:169:28 AM) Age at menarche 11 years. Age of menopause 1046-50 Contraceptive History Oral contraceptives. Gravida 0 Para 0 Regular periods    Review of Systems (Ammie Eversole LPN; 6/0/63016/01/2016 6:019:28 AM) General Not Present- Appetite Loss, Chills, Fatigue, Fever, Night Sweats, Weight Gain and Weight Loss. Skin Not Present- Change in Wart/Mole, Dryness, Hives, Jaundice, New Lesions, Non-Healing Wounds, Rash and Ulcer. HEENT Present- Seasonal Allergies and Wears glasses/contact lenses. Not Present- Earache, Hearing Loss, Hoarseness, Nose Bleed, Oral Ulcers, Ringing in the Ears, Sinus Pain, Sore Throat, Visual Disturbances and Yellow Eyes. Respiratory Present- Snoring. Not Present- Bloody sputum, Chronic Cough, Difficulty Breathing and Wheezing. Breast Not Present- Breast Mass, Breast Pain, Nipple Discharge and Skin Changes. Cardiovascular Not Present- Chest Pain, Difficulty Breathing Lying Down, Leg Cramps, Palpitations, Rapid Heart Rate, Shortness of Breath and Swelling of Extremities. Gastrointestinal  Not Present- Abdominal  Pain, Bloating, Bloody Stool, Change in Bowel Habits, Chronic diarrhea, Constipation, Difficulty Swallowing, Excessive gas, Gets full quickly at meals, Hemorrhoids, Indigestion, Nausea, Rectal Pain and Vomiting. Female Genitourinary Not Present- Frequency, Nocturia, Painful Urination, Pelvic Pain and Urgency. Musculoskeletal Present- Joint Pain. Not Present- Back Pain, Joint Stiffness, Muscle Pain, Muscle Weakness and Swelling of Extremities. Neurological Not Present- Decreased Memory, Fainting, Headaches, Numbness, Seizures, Tingling, Tremor, Trouble walking and Weakness. Psychiatric Not Present- Anxiety, Bipolar, Change in Sleep Pattern, Depression, Fearful and Frequent crying. Endocrine Not Present- Cold Intolerance, Excessive Hunger, Hair Changes, Heat Intolerance, Hot flashes and New Diabetes. Hematology Not Present- Easy Bruising, Excessive bleeding, Gland problems, HIV and Persistent Infections.  Vitals (Ammie Eversole LPN; 05/19/1094 0:45 AM) 10/21/2015 9:28 AM Weight: 210.2 lb Height: 62in Body Surface Area: 1.95 m Body Mass Index: 38.45 kg/m  Temp.: 98.7F(Oral)  Pulse: 82 (Regular)  BP: 142/80 (Sitting, Left Arm, Standard)   Physical Exam  General: moderately obese WF alert and generally healthy appearing. HEENT: Normal. Pupils equal.  Neck: Supple. No mass. No thyroid mass. Lymph Nodes: No supraclavicular, cervical or axaillary nodes.  Lungs: Clear to auscultation and symmetric breath sounds. Heart: RRR. No murmur or rub.  Breasts: Right - puncture scars at 12 o'clock x 2 and 9 o'clock (this was the [facility]). I feel no mass or nipple discharge.  Left - No mass or nipple discharge  Abdomen: Soft. No mass. No tenderness. No hernia. Normal bowel sounds. No abdominal scars.  Extremities: Good strength and ROM in upper and lower extremities.  Neurologic: Grossly intact to motor and sensory function. Psychiatric: Has normal mood and affect.  Behavior is normal.  Assessment & Plan  1.  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  Plan - Right breast biopsy  2.  TYPE 2 DIABETES MELLITUS WITHOUT COMPLICATION, WITHOUT LONG-TERM CURRENT USE OF INSULIN (E11.9)  3. HTN x 5 years 4. History of kidney stones  I think that she saw Dr. Vernie Ammons 5. Morbid obesity - BMI - 38.45  Ovidio Kin, MD, [facility] Pager: [phone] Office phone:  [phone]

## 2015-11-25 ENCOUNTER — Other Ambulatory Visit (HOSPITAL_COMMUNITY): Payer: Self-pay | Admitting: Internal Medicine

## 2015-11-25 ENCOUNTER — Ambulatory Visit (HOSPITAL_COMMUNITY)
Admission: RE | Admit: 2015-11-25 | Discharge: 2015-11-25 | Disposition: A | Discharge status: Home / Self Care | Payer: BLUE CROSS/BLUE SHIELD | Source: Ambulatory Visit | Attending: Surgery | Admitting: Surgery

## 2015-11-25 ENCOUNTER — Ambulatory Visit (HOSPITAL_COMMUNITY): Payer: BLUE CROSS/BLUE SHIELD | Admitting: Anesthesiology

## 2015-11-25 ENCOUNTER — Encounter (HOSPITAL_COMMUNITY)
Admission: RE | Disposition: A | Discharge status: Home / Self Care | Payer: Self-pay | Source: Ambulatory Visit | Attending: Surgery

## 2015-11-25 ENCOUNTER — Ambulatory Visit
Admission: RE | Admit: 2015-11-25 | Discharge: 2015-11-25 | Disposition: A | Discharge status: Home / Self Care | Payer: BLUE CROSS/BLUE SHIELD | Source: Ambulatory Visit | Attending: Surgery | Admitting: Surgery

## 2015-11-25 ENCOUNTER — Encounter (HOSPITAL_COMMUNITY): Payer: Self-pay | Admitting: Surgery

## 2015-11-25 DIAGNOSIS — Z87442 Personal history of urinary calculi: Secondary | ICD-10-CM | POA: Insufficient documentation

## 2015-11-25 DIAGNOSIS — I10 Essential (primary) hypertension: Secondary | ICD-10-CM | POA: Insufficient documentation

## 2015-11-25 DIAGNOSIS — N62 Hypertrophy of breast: Secondary | ICD-10-CM | POA: Insufficient documentation

## 2015-11-25 DIAGNOSIS — E7800 Pure hypercholesterolemia, unspecified: Secondary | ICD-10-CM | POA: Insufficient documentation

## 2015-11-25 DIAGNOSIS — Z6837 Body mass index (BMI) 37.0-37.9, adult: Secondary | ICD-10-CM | POA: Insufficient documentation

## 2015-11-25 DIAGNOSIS — E119 Type 2 diabetes mellitus without complications: Secondary | ICD-10-CM | POA: Insufficient documentation

## 2015-11-25 DIAGNOSIS — Z803 Family history of malignant neoplasm of breast: Secondary | ICD-10-CM | POA: Insufficient documentation

## 2015-11-25 DIAGNOSIS — K219 Gastro-esophageal reflux disease without esophagitis: Secondary | ICD-10-CM | POA: Insufficient documentation

## 2015-11-25 HISTORY — PX: BREAST LUMPECTOMY WITH RADIOACTIVE SEED LOCALIZATION: SHX6424

## 2015-11-25 LAB — GLUCOSE, CAPILLARY
Glucose-Capillary: 121 mg/dL — ABNORMAL HIGH (ref 65–99)
Glucose-Capillary: 136 mg/dL — ABNORMAL HIGH (ref 65–99)

## 2015-11-25 SURGERY — BREAST LUMPECTOMY WITH RADIOACTIVE SEED LOCALIZATION
Anesthesia: General | Site: Breast | Laterality: Right

## 2015-11-25 MED ORDER — TRAMADOL HCL 50 MG PO TABS: 50.0000 mg | ORAL_TABLET | Freq: Four times a day (QID) | ORAL | Status: DC | PRN

## 2015-11-25 MED ORDER — PROMETHAZINE HCL 25 MG/ML IJ SOLN: 6.2500 mg | INTRAMUSCULAR | Status: DC | PRN

## 2015-11-25 MED ORDER — FENTANYL CITRATE (PF) 100 MCG/2ML IJ SOLN
25.0000 ug | INTRAMUSCULAR | Status: DC | PRN
  Administered 2015-11-25: 25 ug via INTRAVENOUS

## 2015-11-25 MED ORDER — MIDAZOLAM HCL 2 MG/2ML IJ SOLN
INTRAMUSCULAR | Status: DC | PRN
  Administered 2015-11-25: 2 mg via INTRAVENOUS

## 2015-11-25 MED ORDER — PROPOFOL 10 MG/ML IV BOLUS
INTRAVENOUS | Status: AC
  Filled 2015-11-25: qty 20

## 2015-11-25 MED ORDER — CHLORHEXIDINE GLUCONATE 4 % EX LIQD: 60.0000 mL | Freq: Once | CUTANEOUS | Status: DC

## 2015-11-25 MED ORDER — LIDOCAINE 2% (20 MG/ML) 5 ML SYRINGE
INTRAMUSCULAR | Status: AC
  Filled 2015-11-25: qty 5

## 2015-11-25 MED ORDER — FENTANYL CITRATE (PF) 250 MCG/5ML IJ SOLN
INTRAMUSCULAR | Status: DC | PRN
  Administered 2015-11-25 (×3): 50 ug via INTRAVENOUS

## 2015-11-25 MED ORDER — 0.9 % SODIUM CHLORIDE (POUR BTL) OPTIME
TOPICAL | Status: DC | PRN
  Administered 2015-11-25: 1000 mL

## 2015-11-25 MED ORDER — OXYCODONE HCL 5 MG/5ML PO SOLN: 5.0000 mg | Freq: Once | ORAL | Status: DC | PRN

## 2015-11-25 MED ORDER — OXYCODONE HCL 5 MG PO TABS
5.0000 mg | ORAL_TABLET | Freq: Once | ORAL | Status: DC | PRN
Start: 2015-11-25 — End: 2015-11-25

## 2015-11-25 MED ORDER — LIDOCAINE HCL (CARDIAC) 20 MG/ML IV SOLN
INTRAVENOUS | Status: DC | PRN
  Administered 2015-11-25: 80 mg via INTRAVENOUS

## 2015-11-25 MED ORDER — LACTATED RINGERS IV SOLN
INTRAVENOUS | Status: DC | PRN
  Administered 2015-11-25: 07:00:00 via INTRAVENOUS

## 2015-11-25 MED ORDER — FENTANYL CITRATE (PF) 250 MCG/5ML IJ SOLN
INTRAMUSCULAR | Status: AC
  Filled 2015-11-25: qty 5

## 2015-11-25 MED ORDER — BUPIVACAINE-EPINEPHRINE (PF) 0.25% -1:200000 IJ SOLN
INTRAMUSCULAR | Status: AC
  Filled 2015-11-25: qty 30

## 2015-11-25 MED ORDER — BUPIVACAINE-EPINEPHRINE 0.25% -1:200000 IJ SOLN
INTRAMUSCULAR | Status: DC | PRN
  Administered 2015-11-25: 30 mL

## 2015-11-25 MED ORDER — PROPOFOL 10 MG/ML IV BOLUS
INTRAVENOUS | Status: DC | PRN
  Administered 2015-11-25: 200 mg via INTRAVENOUS

## 2015-11-25 MED ORDER — ONDANSETRON HCL 4 MG/2ML IJ SOLN
INTRAMUSCULAR | Status: AC
  Filled 2015-11-25: qty 2

## 2015-11-25 MED ORDER — FENTANYL CITRATE (PF) 100 MCG/2ML IJ SOLN
INTRAMUSCULAR | Status: DC
Start: 2015-11-25 — End: 2015-11-25
  Filled 2015-11-25: qty 2

## 2015-11-25 MED ORDER — MIDAZOLAM HCL 2 MG/2ML IJ SOLN
INTRAMUSCULAR | Status: AC
  Filled 2015-11-25: qty 2

## 2015-11-25 MED ORDER — ONDANSETRON HCL 4 MG/2ML IJ SOLN
INTRAMUSCULAR | Status: DC | PRN
  Administered 2015-11-25: 4 mg via INTRAVENOUS

## 2015-11-25 SURGICAL SUPPLY — 47 items
APPLIER CLIP 9.375 MED OPEN (MISCELLANEOUS)
APR CLP MED 9.3 20 MLT OPN (MISCELLANEOUS)
BINDER BREAST LRG (GAUZE/BANDAGES/DRESSINGS) IMPLANT
BINDER BREAST XLRG (GAUZE/BANDAGES/DRESSINGS) ×1 IMPLANT
BLADE SURG 15 STRL LF DISP TIS (BLADE) ×1 IMPLANT
BLADE SURG 15 STRL SS (BLADE) ×2
CANISTER SUCTION 2500CC (MISCELLANEOUS) IMPLANT
CHLORAPREP W/TINT 26ML (MISCELLANEOUS) ×2 IMPLANT
CLIP APPLIE 9.375 MED OPEN (MISCELLANEOUS) IMPLANT
COVER PROBE W GEL 5X96 (DRAPES) ×2 IMPLANT
COVER SURGICAL LIGHT HANDLE (MISCELLANEOUS) ×2 IMPLANT
DEVICE DUBIN SPECIMEN MAMMOGRA (MISCELLANEOUS) ×2 IMPLANT
DRAPE CHEST BREAST 15X10 FENES (DRAPES) ×2 IMPLANT
DRAPE UTILITY XL STRL (DRAPES) ×2 IMPLANT
ELECT CAUTERY BLADE 6.4 (BLADE) ×2 IMPLANT
ELECT COATED BLADE 2.86 ST (ELECTRODE) ×1 IMPLANT
ELECT REM PT RETURN 9FT ADLT (ELECTROSURGICAL) ×2
ELECTRODE REM PT RTRN 9FT ADLT (ELECTROSURGICAL) ×1 IMPLANT
GAUZE SPONGE 4X4 12PLY STRL (GAUZE/BANDAGES/DRESSINGS) ×3 IMPLANT
GLOVE BIOGEL PI IND STRL 6 (GLOVE) IMPLANT
GLOVE BIOGEL PI IND STRL 6.5 (GLOVE) IMPLANT
GLOVE BIOGEL PI IND STRL 7.0 (GLOVE) IMPLANT
GLOVE BIOGEL PI INDICATOR 6 (GLOVE) ×2
GLOVE BIOGEL PI INDICATOR 6.5 (GLOVE) ×1
GLOVE BIOGEL PI INDICATOR 7.0 (GLOVE) ×1
GLOVE SURG SIGNA 7.5 PF LTX (GLOVE) ×2 IMPLANT
GOWN STRL REUS W/ TWL LRG LVL3 (GOWN DISPOSABLE) ×1 IMPLANT
GOWN STRL REUS W/ TWL XL LVL3 (GOWN DISPOSABLE) ×1 IMPLANT
GOWN STRL REUS W/TWL LRG LVL3 (GOWN DISPOSABLE) ×2
GOWN STRL REUS W/TWL XL LVL3 (GOWN DISPOSABLE) ×2
KIT BASIN OR (CUSTOM PROCEDURE TRAY) ×2 IMPLANT
KIT MARKER MARGIN INK (KITS) ×2 IMPLANT
LIQUID BAND (GAUZE/BANDAGES/DRESSINGS) ×2 IMPLANT
NDL HYPO 25X1 1.5 SAFETY (NEEDLE) ×1 IMPLANT
NEEDLE HYPO 25X1 1.5 SAFETY (NEEDLE) ×2 IMPLANT
NS IRRIG 1000ML POUR BTL (IV SOLUTION) IMPLANT
PACK SURGICAL SETUP 50X90 (CUSTOM PROCEDURE TRAY) ×2 IMPLANT
PENCIL BUTTON HOLSTER BLD 10FT (ELECTRODE) ×2 IMPLANT
SPONGE LAP 18X18 X RAY DECT (DISPOSABLE) ×2 IMPLANT
SUT MNCRL AB 4-0 PS2 18 (SUTURE) ×2 IMPLANT
SUT VIC AB 3-0 SH 8-18 (SUTURE) ×2 IMPLANT
SYR BULB 3OZ (MISCELLANEOUS) ×2 IMPLANT
SYR CONTROL 10ML LL (SYRINGE) ×2 IMPLANT
TOWEL OR 17X24 6PK STRL BLUE (TOWEL DISPOSABLE) ×1 IMPLANT
TOWEL OR 17X26 10 PK STRL BLUE (TOWEL DISPOSABLE) ×2 IMPLANT
TUBE CONNECTING 12X1/4 (SUCTIONS) ×1 IMPLANT
YANKAUER SUCT BULB TIP NO VENT (SUCTIONS) ×1 IMPLANT

## 2015-11-25 NOTE — Discharge Instructions (Signed)
[facility] - DISCHARGE INSTRUCTIONS TO PATIENT  Activity:  Driving - May drive in one or two days   Lifting - No lifting more than 15 pounds for 5 days, then no limit  Wound Care:   Leave incision dry for 2 days, then remove bandage and shower.  Diet:  As tolerated  Follow up appointment:  Call Dr. Allene PyoNewman's office [facility] at [phone] for an appointment in 2 to 3 weeks  Medications and dosages:  Resume your home medications.  You have a prescription for:  Ultam  Call Dr. Ezzard StandingNewman or his office  [phone]) if you have:  Temperature greater than 100.4,  Persistent nausea and vomiting,  Severe uncontrolled pain,  Redness, tenderness, or signs of infection (pain, swelling, redness, odor or green/yellow discharge around the site),  Any other questions or concerns you may have after discharge.  In an emergency, call 911 or go to an Emergency Department at a nearby hospital.

## 2015-11-25 NOTE — OR Nursing (Signed)
Fentanyl 75 mcg wasted  In trash with Consulting civil engineerDianne RN.

## 2015-11-25 NOTE — Anesthesia Postprocedure Evaluation (Signed)
Anesthesia Post Note  Patient: Mary PeersShelia L Holden  Procedure(s) Performed: Procedure(s) (LRB): RIGHT BREAST LUMPECTOMY WITH RADIOACTIVE SEED LOCALIZATION (Right)  Patient location during evaluation: PACU Anesthesia Type: General Level of consciousness: awake and alert Pain management: pain level controlled Vital Signs Assessment: post-procedure vital signs reviewed and stable Respiratory status: spontaneous breathing, nonlabored ventilation, respiratory function stable and patient connected to nasal cannula oxygen Cardiovascular status: blood pressure returned to baseline and stable Postop Assessment: no signs of nausea or vomiting Anesthetic complications: no    Last Vitals:  Filed Vitals:   11/25/15 0930 11/25/15 0945  BP: 122/59 130/65  Pulse: 73 75  Temp: 36.6 C   Resp: 15 15    Last Pain:  Filed Vitals:   11/25/15 0946  PainSc: 2                  Reino KentJudd, Hermione Havlicek J

## 2015-11-25 NOTE — Anesthesia Preprocedure Evaluation (Addendum)
Anesthesia Evaluation  Patient identified by MRN, date of birth, ID band Patient awake    Reviewed: Allergy & Precautions, H&P , NPO status , Patient's Chart, lab work & pertinent test results  History of Anesthesia Complications Negative for: history of anesthetic complications  Airway Mallampati: II  TM Distance: >3 FB Neck ROM: full    Dental no notable dental hx. (+) Teeth Intact, Dental Advisory Given   Pulmonary neg pulmonary ROS,    Pulmonary exam normal breath sounds clear to auscultation       Cardiovascular hypertension, Normal cardiovascular exam Rhythm:regular Rate:Normal     Neuro/Psych negative neurological ROS     GI/Hepatic Neg liver ROS, GERD  ,  Endo/Other  diabetes  Renal/GU Renal disease     Musculoskeletal  (+) Arthritis ,   Abdominal   Peds  Hematology negative hematology ROS (+)   Anesthesia Other Findings   Reproductive/Obstetrics negative OB ROS                            Anesthesia Physical Anesthesia Plan  ASA: II  Anesthesia Plan: General   Post-op Pain Management:    Induction: Intravenous  Airway Management Planned: LMA  Additional Equipment:   Intra-op Plan:   Post-operative Plan: Extubation in OR  Informed Consent: I have reviewed the patients History and Physical, chart, labs and discussed the procedure including the risks, benefits and alternatives for the proposed anesthesia with the patient or authorized representative who has indicated his/her understanding and acceptance.   Dental Advisory Given  Plan Discussed with: Anesthesiologist, CRNA and Surgeon  Anesthesia Plan Comments:         Anesthesia Quick Evaluation

## 2015-11-25 NOTE — Transfer of Care (Signed)
Immediate Anesthesia Transfer of Care Note  Patient: Mary PeersShelia L Appelt  Procedure(s) Performed: Procedure(s): RIGHT BREAST LUMPECTOMY WITH RADIOACTIVE SEED LOCALIZATION (Right)  Patient Location: PACU  Anesthesia Type:General  Level of Consciousness: awake, alert  and oriented  Airway & Oxygen Therapy: Patient Spontanous Breathing  Post-op Assessment: Report given to RN  Post vital signs: Reviewed and stable  Last Vitals:  Filed Vitals:   11/25/15 0602 11/25/15 0603  BP: 123/57   Pulse: 72   Temp: 36.7 C   Resp:  20    Last Pain: There were no vitals filed for this visit.       Complications: No apparent anesthesia complications

## 2015-11-25 NOTE — Anesthesia Procedure Notes (Signed)
Procedure Name: LMA Insertion Date/Time: 11/25/2015 7:37 AM Performed by: Jefm MilesENNIE, Lilliam Chamblee E Pre-anesthesia Checklist: Patient identified, Emergency Drugs available, Suction available, Patient being monitored and Timeout performed Patient Re-evaluated:Patient Re-evaluated prior to inductionOxygen Delivery Method: Circle system utilized Preoxygenation: Pre-oxygenation with 100% oxygen Intubation Type: IV induction Ventilation: Mask ventilation without difficulty LMA: LMA inserted LMA Size: 4.0 Number of attempts: 1 Airway Equipment and Method: Stylet Placement Confirmation: positive ETCO2 and breath sounds checked- equal and bilateral Tube secured with: Tape Dental Injury: Teeth and Oropharynx as per pre-operative assessment

## 2015-11-25 NOTE — Op Note (Signed)
11/25/2015  8:36 AM  PATIENT:  Mary Holden DOB: 11/01/1955 MRN: [id number]  PREOP DIAGNOSIS:  Right breast atypical lobular hyperplasia  POSTOP DIAGNOSIS:   Right breast atypical lobular hyperplasia, 10 o'clock position  PROCEDURE:   Procedure(s):  RIGHT BREAST LUMPECTOMY WITH RADIOACTIVE SEED LOCALIZATION  SURGEON:   David Newman, M.D.  ANESTHESIA:   general  Anesthesiologist: Benjamin Judd, MD CRNA: Julie E Rennie, CRNA  General  EBL:  minimal  ml  DRAINS: none   LOCAL MEDICATIONS USED:   30 cc 1/4% marcaine  SPECIMEN:   Right breast lumpectomy  COUNTS CORRECT:  YES  INDICATIONS FOR PROCEDURE:  [name] is a 60 y.o. (DOB: 07/07/1955) white  female whose primary care physician is Zack Hall, MD and comes for right breast lumpectomy.   The options for breast treatment have been discussed with the patient. She elected to proceed with lumpectomy and axillary sentinel lymph node.     The indications and potential complications of surgery were explained to the patient. Potential complications include, but are not limited to, bleeding, infection, the need for further surgery, and nerve injury.     She had a I131 seed placed on 11/24/2015 in her right breast at [facility].  I confirmed the presence of the I131 seed in the pre op area using the Neoprobe.  The seed is in the 10 o'clock position of the right breast.   In the holding area.   OPERATIVE NOTE:   The patient was taken to room # 1 at [facility] where she underwent a general anesthesia  supervised by Anesthesiologist: Benjamin Judd, MD CRNA: Julie E Rennie, CRNA. Her right breast and axilla were prepped with  ChloraPrep and sterilely draped.    A time-out and the surgical check list was reviewed.    I made an incision over the abnormality/seed which was about at the 10 o'clock position of the right breast, almost towards the axilla.   I used the Neoprobe to identify the I131 seed.  I tried to excise an  area around the tumor of at least 1 cm.    I excised this block of breast tissue approximately 4 cm by 4 cm  in diameter.  I went down to the chest wall.   I painted the lumpectomy specimen with the 6 color paint kit and did a specimen mammogram which confirmed the mass, clip, and the seed were all in the right position in the specimen.  The specimen was sent to pathology who called back to confirm that they have the seed and the specimen.   I then irrigated the wound with saline. I infiltrated approximately 30 mL of 1/4% marcaine between the incisions.  I then closed all the wounds in layers using 3-0 Vicryl sutures for the deep layer. At the skin, I closed the incisions with a 4-0 Monocryl suture. The incisions were then painted with LiquiBand.  She had gauze place over the wounds and placed in a breast binder.   The patient tolerated the procedure well, was transported to the recovery room in good condition. Sponge and needle count were correct at the end of the case.   Final pathology is pending.   David Newman, MD, FACS [facility] Pager: [phone] Office phone:  [phone]      

## 2015-11-25 NOTE — Interval H&P Note (Signed)
History and Physical Interval Note:  11/25/2015 7:22 AM  Mary PeersShelia L Holden  has presented today for surgery, with the diagnosis of Right breast atypical lobular hyperplasia  The various methods of treatment have been discussed with the patient and family. After consideration of risks, benefits and other options for treatment, the patient has consented to  Procedure(s): RIGHT BREAST LUMPECTOMY WITH RADIOACTIVE SEED LOCALIZATION (Right) as a surgical intervention .  The patient's history has been reviewed, patient examined, no change in status, stable for surgery.  I have reviewed the patient's chart and labs.  Questions were answered to the patient's satisfaction.     Chadwin Fury H

## 2015-11-28 ENCOUNTER — Encounter (HOSPITAL_COMMUNITY): Payer: Self-pay | Admitting: Surgery

## 2015-12-03 ENCOUNTER — Encounter: Payer: Self-pay | Admitting: General Surgery

## 2015-12-03 NOTE — Progress Notes (Unsigned)
She called complaining of a rash that started on her right upper arm yesterday and is spreading to her right chest and back.  She is s/p right lumpectomy on 11/25/15.  Rash did not start at surgical site which she reports is slightly pink.  I recommended she take Benadryl 25 mg every 4 hours for 24 hours, if rash is not better

## 2016-11-29 ENCOUNTER — Other Ambulatory Visit (HOSPITAL_COMMUNITY): Payer: Self-pay | Admitting: Internal Medicine

## 2016-11-29 DIAGNOSIS — R7989 Other specified abnormal findings of blood chemistry: Secondary | ICD-10-CM

## 2016-11-29 DIAGNOSIS — R945 Abnormal results of liver function studies: Secondary | ICD-10-CM

## 2016-12-04 ENCOUNTER — Ambulatory Visit (HOSPITAL_COMMUNITY): Admission: RE | Admit: 2016-12-04 | Payer: BLUE CROSS/BLUE SHIELD | Source: Ambulatory Visit

## 2016-12-14 ENCOUNTER — Ambulatory Visit (HOSPITAL_COMMUNITY)
Admission: RE | Admit: 2016-12-14 | Discharge: 2016-12-14 | Disposition: A | Discharge status: Home / Self Care | Payer: BLUE CROSS/BLUE SHIELD | Source: Ambulatory Visit | Attending: Internal Medicine | Admitting: Internal Medicine

## 2016-12-14 DIAGNOSIS — R7989 Other specified abnormal findings of blood chemistry: Secondary | ICD-10-CM

## 2016-12-14 DIAGNOSIS — R945 Abnormal results of liver function studies: Secondary | ICD-10-CM

## 2016-12-14 DIAGNOSIS — K8020 Calculus of gallbladder without cholecystitis without obstruction: Secondary | ICD-10-CM | POA: Insufficient documentation

## 2016-12-14 DIAGNOSIS — K760 Fatty (change of) liver, not elsewhere classified: Secondary | ICD-10-CM | POA: Insufficient documentation

## 2016-12-14 DIAGNOSIS — R160 Hepatomegaly, not elsewhere classified: Secondary | ICD-10-CM | POA: Insufficient documentation

## 2018-02-06 ENCOUNTER — Encounter: Payer: Self-pay | Admitting: Internal Medicine

## 2018-05-20 NOTE — Progress Notes (Signed)
Primary Care Physician:  Celene Squibb, MD Primary Gastroenterologist:  Dr. Gala Romney   Chief Complaint  Patient presents with  . elevated lft  . change in bowels    sometimes does not always make it to the bathroom    HPI:   Mary Holden is a 63 y.o. female presenting today at the request of Dr. Nevada Crane secondary to elevated LFTs in setting of known fatty liver. RUQ [place] limited in 2018 with hepatomegaly, hepatic steatosis. Overdue for surveillance colonoscopy, with adenomatous colon polyps in 2010.   Outside labs from Sept 2019 with AST 103, ALT 117, Alk Phos 62, Tbili 0.3. Hgb 12.4, Platelets normal at 240. A1c 8.0. Reportedly hepatitis labs negative, but we do not have these on file. Due for more blood work at Dr. Juel Burrow office at end of the month.   Sometimes abdominal pain, upper abdomen. Not often. Hamburgers at fast foods will cause discomfort a few hours later like a "belly ache" then resulting in diarrhea. Tries to stay away from hamburgers. Baseline bowel habits is daily. Sometimes doesn't make it to the bathroom due to fecal urgency, resulting in loose stool and sometimes soft stool. Unable to attribute to types of food. Present for a few years. Gets cold sweats when urgency happens. No rectal bleeding. No N/V. No supplemental fiber. Feels like for most part BMs are productive. No dysphagia. Chronic GERD, now taking Prevacid daily OTC.   Past Medical History:  Diagnosis Date  . Arthritis 2015   R knee cortisone injection, has done well   . Diabetes mellitus without complication ([place])   . GERD (gastroesophageal reflux disease)   . Heart murmur   . Hypercholesterolemia   . Hypertension   . Kidney stone     Past Surgical History:  Procedure Laterality Date  . BREAST LUMPECTOMY WITH RADIOACTIVE SEED LOCALIZATION Right 11/25/2015   Procedure: RIGHT BREAST LUMPECTOMY WITH RADIOACTIVE SEED LOCALIZATION;  Surgeon: Alphonsa Overall, MD;  Location: [facility];  Service: General;   Laterality: Right;  . COLONOSCOPY  2010   Adenomatous colon polyps  . TONSILLECTOMY  age 33  . WISDOM TOOTH EXTRACTION  2001    Current Outpatient Medications  Medication Sig Dispense Refill  . aspirin 81 MG tablet Take 81 mg by mouth at bedtime.    . diclofenac (VOLTAREN) 75 MG EC tablet Take 75 mg by mouth 2 (two) times daily as needed.    Marland Kitchen Ertugliflozin L-PyroglutamicAc (STEGLATRO) 15 MG TABS Take by mouth daily.    . lansoprazole (PREVACID) 15 MG capsule Take 15 mg by mouth 2 (two) times daily before a meal.    . lisinopril (PRINIVIL,ZESTRIL) 20 MG tablet Take 20 mg by mouth every morning.    . loratadine (CLARITIN) 10 MG tablet Take 10 mg by mouth daily. Reported on 11/22/2015    . montelukast (SINGULAIR) 10 MG tablet Take 10 mg by mouth at bedtime.    . pravastatin (PRAVACHOL) 20 MG tablet Take 20 mg by mouth daily.    . sitaGLIPtin-metformin (JANUMET) 50-1000 MG tablet Take 1 tablet by mouth 2 (two) times daily with a meal.     No current facility-administered medications for this visit.     Allergies as of 05/21/2018 - Review Complete 05/21/2018  Allergen Reaction Noted  . Codeine Nausea Only   . Promethazine hcl Other (See Comments)   . Sulfa antibiotics Rash 06/22/2013    Family History  Problem Relation Age of Onset  . Throat  cancer Mother   . Breast cancer Mother   . Lung cancer Father   . Throat cancer Brother   . Colon cancer Neg Hx     Social History   Socioeconomic History  . Marital status: Single    Spouse name: Not on file  . Number of children: Not on file  . Years of education: Not on file  . Highest education level: Not on file  Occupational History  . Occupation: RETIRED  Social Needs  . Financial resource strain: Not on file  . Food insecurity:    Worry: Not on file    Inability: Not on file  . Transportation needs:    Medical: Not on file    Non-medical: Not on file  Tobacco Use  . Smoking status: Never Smoker  . Smokeless tobacco:  Never Used  Substance and Sexual Activity  . Alcohol use: Yes    Comment: occassionally beer, on weekends but not every weekend.   . Drug use: No  . Sexual activity: Not on file  Lifestyle  . Physical activity:    Days per week: Not on file    Minutes per session: Not on file  . Stress: Not on file  Relationships  . Social connections:    Talks on phone: Not on file    Gets together: Not on file    Attends religious service: Not on file    Active member of club or organization: Not on file    Attends meetings of clubs or organizations: Not on file    Relationship status: Not on file  . Intimate partner violence:    Fear of current or ex partner: Not on file    Emotionally abused: Not on file    Physically abused: Not on file    Forced sexual activity: Not on file  Other Topics Concern  . Not on file  Social History Narrative  . Not on file    Review of Systems: Gen: Denies any fever, chills, fatigue, weight loss, lack of appetite.  CV: Denies chest pain, heart palpitations, peripheral edema, syncope.  Resp: Denies shortness of breath at rest or with exertion. Denies wheezing or cough.  GI: see HPI  GU : Denies urinary burning, urinary frequency, urinary hesitancy MS: Denies joint pain, muscle weakness, cramps, or limitation of movement.  Derm: Denies rash, itching, dry skin Psych: Denies depression, anxiety, memory loss, and confusion Heme: Denies bruising, bleeding, and enlarged lymph nodes.  Physical Exam: BP 130/66   Pulse 80   Temp (!) 97 F (36.1 C) (Oral)   Ht 5' 2"  (1.575 m)   Wt 210 lb 6.4 oz (95.4 kg)   BMI 38.48 kg/m  General:   Alert and oriented. Pleasant and cooperative. Well-nourished and well-developed.  Head:  Normocephalic and atraumatic. Eyes:  Without icterus, sclera clear and conjunctiva pink.  Ears:  Normal auditory acuity. Nose:  No deformity, discharge,  or lesions. Mouth:  No deformity or lesions, oral mucosa pink.  Lungs:  Clear to  auscultation bilaterally.  Heart:  S1, S2 present without murmurs appreciated.  Abdomen:  +BS, soft, non-tender and non-distended. No HSM noted. No guarding or rebound. No masses appreciated.  Rectal:  Deferred  Msk:  Symmetrical without gross deformities. Normal posture. Extremities:  Without  edema. Neurologic:  Alert and  oriented x4 Skin:  Intact without significant lesions or rashes. Psych:  Alert and cooperative. Normal mood and affect.

## 2018-05-20 NOTE — H&P (View-Only) (Signed)
Primary Care Physician:  Celene Squibb, MD Primary Gastroenterologist:  Dr. Gala Romney   Chief Complaint  Patient presents with  . elevated lft  . change in bowels    sometimes does not always make it to the bathroom    HPI:   Mary Holden is a 63 y.o. female presenting today at the request of Dr. Nevada Crane secondary to elevated LFTs in setting of known fatty liver. RUQ [place] limited in 2018 with hepatomegaly, hepatic steatosis. Overdue for surveillance colonoscopy, with adenomatous colon polyps in 2010.   Outside labs from Sept 2019 with AST 103, ALT 117, Alk Phos 62, Tbili 0.3. Hgb 12.4, Platelets normal at 240. A1c 8.0. Reportedly hepatitis labs negative, but we do not have these on file. Due for more blood work at Dr. Juel Burrow office at end of the month.   Sometimes abdominal pain, upper abdomen. Not often. Hamburgers at fast foods will cause discomfort a few hours later like a "belly ache" then resulting in diarrhea. Tries to stay away from hamburgers. Baseline bowel habits is daily. Sometimes doesn't make it to the bathroom due to fecal urgency, resulting in loose stool and sometimes soft stool. Unable to attribute to types of food. Present for a few years. Gets cold sweats when urgency happens. No rectal bleeding. No N/V. No supplemental fiber. Feels like for most part BMs are productive. No dysphagia. Chronic GERD, now taking Prevacid daily OTC.   Past Medical History:  Diagnosis Date  . Arthritis 2015   R knee cortisone injection, has done well   . Diabetes mellitus without complication ([place])   . GERD (gastroesophageal reflux disease)   . Heart murmur   . Hypercholesterolemia   . Hypertension   . Kidney stone     Past Surgical History:  Procedure Laterality Date  . BREAST LUMPECTOMY WITH RADIOACTIVE SEED LOCALIZATION Right 11/25/2015   Procedure: RIGHT BREAST LUMPECTOMY WITH RADIOACTIVE SEED LOCALIZATION;  Surgeon: Alphonsa Overall, MD;  Location: [facility];  Service: General;   Laterality: Right;  . COLONOSCOPY  2010   Adenomatous colon polyps  . TONSILLECTOMY  age 67  . WISDOM TOOTH EXTRACTION  2001    Current Outpatient Medications  Medication Sig Dispense Refill  . aspirin 81 MG tablet Take 81 mg by mouth at bedtime.    . diclofenac (VOLTAREN) 75 MG EC tablet Take 75 mg by mouth 2 (two) times daily as needed.    Marland Kitchen Ertugliflozin L-PyroglutamicAc (STEGLATRO) 15 MG TABS Take by mouth daily.    . lansoprazole (PREVACID) 15 MG capsule Take 15 mg by mouth 2 (two) times daily before a meal.    . lisinopril (PRINIVIL,ZESTRIL) 20 MG tablet Take 20 mg by mouth every morning.    . loratadine (CLARITIN) 10 MG tablet Take 10 mg by mouth daily. Reported on 11/22/2015    . montelukast (SINGULAIR) 10 MG tablet Take 10 mg by mouth at bedtime.    . pravastatin (PRAVACHOL) 20 MG tablet Take 20 mg by mouth daily.    . sitaGLIPtin-metformin (JANUMET) 50-1000 MG tablet Take 1 tablet by mouth 2 (two) times daily with a meal.     No current facility-administered medications for this visit.     Allergies as of 05/21/2018 - Review Complete 05/21/2018  Allergen Reaction Noted  . Codeine Nausea Only   . Promethazine hcl Other (See Comments)   . Sulfa antibiotics Rash 06/22/2013    Family History  Problem Relation Age of Onset  . Throat  cancer Mother   . Breast cancer Mother   . Lung cancer Father   . Throat cancer Brother   . Colon cancer Neg Hx     Social History   Socioeconomic History  . Marital status: Single    Spouse name: Not on file  . Number of children: Not on file  . Years of education: Not on file  . Highest education level: Not on file  Occupational History  . Occupation: RETIRED  Social Needs  . Financial resource strain: Not on file  . Food insecurity:    Worry: Not on file    Inability: Not on file  . Transportation needs:    Medical: Not on file    Non-medical: Not on file  Tobacco Use  . Smoking status: Never Smoker  . Smokeless tobacco:  Never Used  Substance and Sexual Activity  . Alcohol use: Yes    Comment: occassionally beer, on weekends but not every weekend.   . Drug use: No  . Sexual activity: Not on file  Lifestyle  . Physical activity:    Days per week: Not on file    Minutes per session: Not on file  . Stress: Not on file  Relationships  . Social connections:    Talks on phone: Not on file    Gets together: Not on file    Attends religious service: Not on file    Active member of club or organization: Not on file    Attends meetings of clubs or organizations: Not on file    Relationship status: Not on file  . Intimate partner violence:    Fear of current or ex partner: Not on file    Emotionally abused: Not on file    Physically abused: Not on file    Forced sexual activity: Not on file  Other Topics Concern  . Not on file  Social History Narrative  . Not on file    Review of Systems: Gen: Denies any fever, chills, fatigue, weight loss, lack of appetite.  CV: Denies chest pain, heart palpitations, peripheral edema, syncope.  Resp: Denies shortness of breath at rest or with exertion. Denies wheezing or cough.  GI: see HPI  GU : Denies urinary burning, urinary frequency, urinary hesitancy MS: Denies joint pain, muscle weakness, cramps, or limitation of movement.  Derm: Denies rash, itching, dry skin Psych: Denies depression, anxiety, memory loss, and confusion Heme: Denies bruising, bleeding, and enlarged lymph nodes.  Physical Exam: BP 130/66   Pulse 80   Temp (!) 97 F (36.1 C) (Oral)   Ht 5' 2"  (1.575 m)   Wt 210 lb 6.4 oz (95.4 kg)   BMI 38.48 kg/m  General:   Alert and oriented. Pleasant and cooperative. Well-nourished and well-developed.  Head:  Normocephalic and atraumatic. Eyes:  Without icterus, sclera clear and conjunctiva pink.  Ears:  Normal auditory acuity. Nose:  No deformity, discharge,  or lesions. Mouth:  No deformity or lesions, oral mucosa pink.  Lungs:  Clear to  auscultation bilaterally.  Heart:  S1, S2 present without murmurs appreciated.  Abdomen:  +BS, soft, non-tender and non-distended. No HSM noted. No guarding or rebound. No masses appreciated.  Rectal:  Deferred  Msk:  Symmetrical without gross deformities. Normal posture. Extremities:  Without  edema. Neurologic:  Alert and  oriented x4 Skin:  Intact without significant lesions or rashes. Psych:  Alert and cooperative. Normal mood and affect.

## 2018-05-21 ENCOUNTER — Telehealth: Payer: Self-pay

## 2018-05-21 ENCOUNTER — Encounter: Payer: Self-pay | Admitting: Gastroenterology

## 2018-05-21 ENCOUNTER — Other Ambulatory Visit: Payer: Self-pay

## 2018-05-21 ENCOUNTER — Ambulatory Visit: Payer: BLUE CROSS/BLUE SHIELD | Admitting: Gastroenterology

## 2018-05-21 VITALS — BP 130/66 | HR 80 | Temp 97.0°F | Ht 62.0 in | Wt 210.4 lb

## 2018-05-21 DIAGNOSIS — R152 Fecal urgency: Secondary | ICD-10-CM | POA: Diagnosis not present

## 2018-05-21 DIAGNOSIS — Z860100 Personal history of colon polyps, unspecified: Secondary | ICD-10-CM | POA: Insufficient documentation

## 2018-05-21 DIAGNOSIS — R748 Abnormal levels of other serum enzymes: Secondary | ICD-10-CM | POA: Diagnosis not present

## 2018-05-21 MED ORDER — NA SULFATE-K SULFATE-MG SULF 17.5-3.13-1.6 GM/177ML PO SOLN: 1.0000 | ORAL | 0 refills | Status: DC

## 2018-05-21 NOTE — Patient Instructions (Addendum)
Please have blood work done when you see Dr. Margo Aye at the end of the month. We will request these if they don't automatically result to me as ordering provider.  We have ordered a complete ultrasound of your abdomen.  I have included a brief handout on fatty liver.   It will be wonderful to get diabetes under strict control.   I would like for you to start taking supplemental Benefiber (or generic equivalent from [employer]) daily as directed on bottle. Also keep food/stool diary to see if there are any patterns.  We have arranged a colonoscopy with Dr. Jena Gauss in the near future.  I will see you back in 3-4 months!  It was a pleasure to see you today. I strive to create trusting relationships with patients to provide genuine, compassionate, and quality care. I value your feedback. If you receive a survey regarding your visit,  I greatly appreciate you taking time to fill this out.   Gelene Mink, PhD, ANP-BC [facility]    Fatty Liver Disease  Fatty liver disease occurs when too much fat has built up in your liver cells. Fatty liver disease is also called hepatic steatosis or steatohepatitis. The liver removes harmful substances from your bloodstream and produces fluids that your body needs. It also helps your body use and store energy from the food you eat. In many cases, fatty liver disease does not cause symptoms or problems. It is often diagnosed when tests are being done for other reasons. However, over time, fatty liver can cause inflammation that may lead to more serious liver problems, such as scarring of the liver (cirrhosis) and liver failure. Fatty liver is associated with insulin resistance, increased body fat, high blood pressure (hypertension), and high cholesterol. These are features of metabolic syndrome and increase your risk for stroke, diabetes, and heart disease. What are the causes? This condition may be caused by:  Drinking too much alcohol.  Poor  nutrition.  Obesity.  Cushing's syndrome.  Diabetes.  High cholesterol.  Certain drugs.  Poisons.  Some viral infections.  Pregnancy. What increases the risk? You are more likely to develop this condition if you:  Abuse alcohol.  Are overweight.  Have diabetes.  Have hepatitis.  Have a high triglyceride level.  Are pregnant. What are the signs or symptoms? Fatty liver disease often does not cause symptoms. If symptoms do develop, they can include:  Fatigue.  Weakness.  Weight loss.  Confusion.  Abdominal pain.  Nausea and vomiting.  Yellowing of your skin and the white parts of your eyes (jaundice).  Itchy skin. How is this diagnosed? This condition may be diagnosed by:  A physical exam and medical history.  Blood tests.  Imaging tests, such as an ultrasound, CT scan, or MRI.  A liver biopsy. A small sample of liver tissue is removed using a needle. The sample is then looked at under a microscope. How is this treated? Fatty liver disease is often caused by other health conditions. Treatment for fatty liver may involve medicines and lifestyle changes to manage conditions such as:  Alcoholism.  High cholesterol.  Diabetes.  Being overweight or obese. Follow these instructions at home:   Do not drink alcohol. If you have trouble quitting, ask your health care provider how to safely quit with the help of medicine or a supervised program. This is important to keep your condition from getting worse.  Eat a healthy diet as told by your health care provider. Ask your health  care provider about working with a diet and nutrition specialist (dietitian) to develop an eating plan.  Exercise regularly. This can help you lose weight and control your cholesterol and diabetes. Talk to your health care provider about an exercise plan and which activities are best for you.  Take over-the-counter and prescription medicines only as told by your health care  provider.  Keep all follow-up visits as told by your health care provider. This is important. Contact a health care provider if: You have trouble controlling your:  Blood sugar. This is especially important if you have diabetes.  Cholesterol.  Drinking of alcohol. Get help right away if:  You have abdominal pain.  You have jaundice.  You have nausea and vomiting.  You vomit blood or material that looks like coffee grounds.  You have stools that are black, tar-like, or bloody. Summary  Fatty liver disease develops when too much fat builds up in the cells of your liver.  Fatty liver disease often causes no symptoms or problems. However, over time, fatty liver can cause inflammation that may lead to more serious liver problems, such as scarring of the liver (cirrhosis).  You are more likely to develop this condition if you abuse alcohol, are pregnant, are overweight, have diabetes, have hepatitis, or have high triglyceride levels.  Contact your health care provider if you have trouble controlling your weight, blood sugar, cholesterol, or drinking of alcohol. This information is not intended to replace advice given to you by your health care provider. Make sure you discuss any questions you have with your health care provider. Document Released: 06/15/2005 Document Revised: 02/06/2017 Document Reviewed: 02/06/2017 Elsevier Interactive Patient Education  2019 [employer]

## 2018-05-21 NOTE — Assessment & Plan Note (Signed)
63 year old female with mildly elevated transaminases, three times upper limits of normal in setting of fatty liver. Reportedly negative hepatitis panel through PCP office, which we are requesting. Patient also notes transaminases have been increasing. Also notable is a recent A1c of 8 in Sept 2019, and she will be due for repeat labs at the end of the month.  Discussed in detail aggressive dietary changes, complete avoidance of alcohol, weight loss. Will check further serologies now, and labs have been printed to be done at [facility]. Update US abdomen complete. Return in 3-4 months

## 2018-05-21 NOTE — Telephone Encounter (Signed)
Called and informed pt of pre-op appt 06/12/18 at 1:45pm. Letter mailed.

## 2018-05-21 NOTE — Patient Instructions (Signed)
AB advised for pt to hold Janumet the morning of TCS. Noted on instructions and pt aware.

## 2018-05-21 NOTE — Assessment & Plan Note (Signed)
Keep diary. Add Benefiber daily. Colonoscopy as planned. 3-4 month return.

## 2018-05-21 NOTE — Assessment & Plan Note (Signed)
History of adenomatous colon polyps in 2010 and overdue now. No rectal bleeding but does note intermittent fecal urgency. Strongly suspect this may be dietary related, as she has spans of time without any issues.   Proceed with TCS with Dr. Jena Gauss in near future: the risks, benefits, and alternatives have been discussed with the patient in detail. The patient states understanding and desires to proceed. Propofol

## 2018-05-22 NOTE — Progress Notes (Signed)
cc'd to pcp 

## 2018-05-27 ENCOUNTER — Ambulatory Visit (HOSPITAL_COMMUNITY): Payer: BLUE CROSS/BLUE SHIELD

## 2018-06-06 ENCOUNTER — Ambulatory Visit (HOSPITAL_COMMUNITY)
Admission: RE | Admit: 2018-06-06 | Discharge: 2018-06-06 | Disposition: A | Discharge status: Home / Self Care | Payer: BLUE CROSS/BLUE SHIELD | Source: Ambulatory Visit | Attending: Gastroenterology | Admitting: Gastroenterology

## 2018-06-06 DIAGNOSIS — R748 Abnormal levels of other serum enzymes: Secondary | ICD-10-CM | POA: Diagnosis present

## 2018-06-09 ENCOUNTER — Other Ambulatory Visit (HOSPITAL_COMMUNITY): Payer: Self-pay | Admitting: Internal Medicine

## 2018-06-09 DIAGNOSIS — N6022 Fibroadenosis of left breast: Principal | ICD-10-CM

## 2018-06-09 DIAGNOSIS — N6021 Fibroadenosis of right breast: Secondary | ICD-10-CM

## 2018-06-10 NOTE — Patient Instructions (Signed)
   Your procedure is scheduled on: 06/19/2018  Report to [facility] at   6:15  AM.  Call this number if you have problems the morning of surgery: [phone]   Remember:              Follow Directions on the letter you received from Your Physician's office regarding the Bowel Prep  :  Take these medicines the morning of surgery with A SIP OF WATER: Lansoprazole. Lisinopril, claritin and montelukast   Do not wear jewelry, make-up or nail polish.    Do not bring valuables to the hospital.  Contacts, dentures or bridgework may not be worn into surgery.  .   Patients discharged the day of surgery will not be allowed to drive home.     Colonoscopy, Adult, Care After This sheet gives you information about how to care for yourself after your procedure. Your health care provider may also give you more specific instructions. If you have problems or questions, contact your health care provider. What can I expect after the procedure? After the procedure, it is common to have:  A small amount of blood in your stool for 24 hours after the procedure.  Some gas.  Mild abdominal cramping or bloating.  Follow these instructions at home: General instructions   For the first 24 hours after the procedure: ? Do not drive or use machinery. ? Do not sign important documents. ? Do not drink alcohol. ? Do your regular daily activities at a slower pace than normal. ? Eat soft, easy-to-digest foods. ? Rest often.  Take over-the-counter or prescription medicines only as told by your health care provider.  It is up to you to get the results of your procedure. Ask your health care provider, or the department performing the procedure, when your results will be ready. Relieving cramping and bloating  Try walking around when you have cramps or feel bloated.  Apply heat to your abdomen as told by your health care provider. Use a heat source that your health care provider recommends, such as a moist  heat pack or a heating pad. ? Place a towel between your skin and the heat source. ? Leave the heat on for 20-30 minutes. ? Remove the heat if your skin turns bright red. This is especially important if you are unable to feel pain, heat, or cold. You may have a greater risk of getting burned. Eating and drinking  Drink enough fluid to keep your urine clear or pale yellow.  Resume your normal diet as instructed by your health care provider. Avoid heavy or fried foods that are hard to digest.  Avoid drinking alcohol for as long as instructed by your health care provider. Contact a health care provider if:  You have blood in your stool 2-3 days after the procedure. Get help right away if:  You have more than a small spotting of blood in your stool.  You pass large blood clots in your stool.  Your abdomen is swollen.  You have nausea or vomiting.  You have a fever.  You have increasing abdominal pain that is not relieved with medicine. This information is not intended to replace advice given to you by your health care provider. Make sure you discuss any questions you have with your health care provider. Document Released: 12/13/2003 Document Revised: 01/23/2016 Document Reviewed: 07/12/2015 Elsevier Interactive Patient Education  [employer].

## 2018-06-12 ENCOUNTER — Encounter (HOSPITAL_COMMUNITY)
Admission: RE | Admit: 2018-06-12 | Discharge: 2018-06-12 | Disposition: A | Discharge status: Home / Self Care | Payer: BLUE CROSS/BLUE SHIELD | Source: Ambulatory Visit | Attending: Internal Medicine | Admitting: Internal Medicine

## 2018-06-12 ENCOUNTER — Encounter (HOSPITAL_COMMUNITY): Payer: Self-pay

## 2018-06-12 ENCOUNTER — Telehealth: Payer: Self-pay

## 2018-06-12 ENCOUNTER — Other Ambulatory Visit: Payer: Self-pay

## 2018-06-12 DIAGNOSIS — R9431 Abnormal electrocardiogram [ECG] [EKG]: Secondary | ICD-10-CM | POA: Diagnosis not present

## 2018-06-12 DIAGNOSIS — Z01810 Encounter for preprocedural cardiovascular examination: Secondary | ICD-10-CM | POA: Diagnosis present

## 2018-06-12 HISTORY — DX: Personal history of urinary calculi: Z87.442

## 2018-06-12 HISTORY — DX: Anemia, unspecified: D64.9

## 2018-06-12 NOTE — Progress Notes (Signed)
   06/12/18 1347  OBSTRUCTIVE SLEEP APNEA  Have you ever been diagnosed with sleep apnea through a sleep study? No  Do you snore loudly (loud enough to be heard through closed doors)?  1  Do you often feel tired, fatigued, or sleepy during the daytime (such as falling asleep during driving or talking to someone)? 0  Has anyone observed you stop breathing during your sleep? 0  Do you have, or are you being treated for high blood pressure? 1  BMI more than 35 kg/m2? 1  Age > 50 (1-yes) 1  Neck circumference greater than:Female 16 inches or larger, Female 17inches or larger? 0  Female Gender (Yes=1) 0  Obstructive Sleep Apnea Score 4  Score 5 or greater  Results sent to PCP

## 2018-06-12 NOTE — Telephone Encounter (Signed)
labwork from Dr. Marcelo Baldy office from [facility] was placed on AB files next to her desk.

## 2018-06-13 NOTE — Telephone Encounter (Signed)
Dated Jun 05, 2018  Ferritin 50, iron 36, iron sats 9 (low). WBC mildly elevated at 12.5. Hgb 12.3, Hct 36.8, BUN 16, Creatinine 0.91, Alk Phos 53, AST 31, ALT elevated at 62, immunoglobulins all within normal range, alpha-1 antitrypsin normal at 170 phenotype MM, ASMA normal at 17, AMA negative, ANA negative, ceruloplasmin normal at 29. A1c is improved at 6.4%, down from 8 previous check. Likely dealing with fatty liver.  Manuela Schwartz: can we get the hepatitis panel from PCP?

## 2018-06-16 NOTE — Telephone Encounter (Signed)
Requested labs from Dr Hall's office °

## 2018-06-16 NOTE — Progress Notes (Signed)
Fatty liver on ultrasound. Outside labs reviewed without concerning findings. Still awaiting hepatitis panel from PCP. Dealing with fatty liver likely.

## 2018-06-19 ENCOUNTER — Encounter (HOSPITAL_COMMUNITY)
Admission: RE | Disposition: A | Discharge status: Home / Self Care | Payer: Self-pay | Source: Ambulatory Visit | Attending: Internal Medicine

## 2018-06-19 ENCOUNTER — Ambulatory Visit (HOSPITAL_COMMUNITY)
Admission: RE | Admit: 2018-06-19 | Discharge: 2018-06-19 | Disposition: A | Discharge status: Home / Self Care | Payer: BLUE CROSS/BLUE SHIELD | Source: Ambulatory Visit | Attending: Internal Medicine | Admitting: Internal Medicine

## 2018-06-19 ENCOUNTER — Ambulatory Visit (HOSPITAL_COMMUNITY): Payer: BLUE CROSS/BLUE SHIELD | Admitting: Anesthesiology

## 2018-06-19 ENCOUNTER — Encounter (HOSPITAL_COMMUNITY): Payer: Self-pay | Admitting: *Deleted

## 2018-06-19 DIAGNOSIS — Z79899 Other long term (current) drug therapy: Secondary | ICD-10-CM | POA: Diagnosis not present

## 2018-06-19 DIAGNOSIS — Z882 Allergy status to sulfonamides status: Secondary | ICD-10-CM | POA: Insufficient documentation

## 2018-06-19 DIAGNOSIS — Z1211 Encounter for screening for malignant neoplasm of colon: Secondary | ICD-10-CM | POA: Insufficient documentation

## 2018-06-19 DIAGNOSIS — Z7984 Long term (current) use of oral hypoglycemic drugs: Secondary | ICD-10-CM | POA: Insufficient documentation

## 2018-06-19 DIAGNOSIS — E119 Type 2 diabetes mellitus without complications: Secondary | ICD-10-CM | POA: Diagnosis not present

## 2018-06-19 DIAGNOSIS — Z86010 Personal history of colonic polyps: Secondary | ICD-10-CM | POA: Insufficient documentation

## 2018-06-19 DIAGNOSIS — K219 Gastro-esophageal reflux disease without esophagitis: Secondary | ICD-10-CM | POA: Insufficient documentation

## 2018-06-19 DIAGNOSIS — E7800 Pure hypercholesterolemia, unspecified: Secondary | ICD-10-CM | POA: Diagnosis not present

## 2018-06-19 DIAGNOSIS — Z885 Allergy status to narcotic agent status: Secondary | ICD-10-CM | POA: Diagnosis not present

## 2018-06-19 DIAGNOSIS — I10 Essential (primary) hypertension: Secondary | ICD-10-CM | POA: Insufficient documentation

## 2018-06-19 DIAGNOSIS — Z7982 Long term (current) use of aspirin: Secondary | ICD-10-CM | POA: Diagnosis not present

## 2018-06-19 DIAGNOSIS — Z888 Allergy status to other drugs, medicaments and biological substances status: Secondary | ICD-10-CM | POA: Insufficient documentation

## 2018-06-19 HISTORY — PX: COLONOSCOPY WITH PROPOFOL: SHX5780

## 2018-06-19 LAB — GLUCOSE, CAPILLARY
Glucose-Capillary: 149 mg/dL — ABNORMAL HIGH (ref 70–99)
Glucose-Capillary: 128 mg/dL — ABNORMAL HIGH (ref 70–99)

## 2018-06-19 SURGERY — COLONOSCOPY WITH PROPOFOL
Anesthesia: Monitor Anesthesia Care

## 2018-06-19 MED ORDER — KETOROLAC TROMETHAMINE 30 MG/ML IJ SOLN: 30.0000 mg | Freq: Once | INTRAMUSCULAR | Status: DC | PRN

## 2018-06-19 MED ORDER — HYDROMORPHONE HCL 1 MG/ML IJ SOLN: 0.2500 mg | INTRAMUSCULAR | Status: DC | PRN

## 2018-06-19 MED ORDER — PROPOFOL 10 MG/ML IV BOLUS
INTRAVENOUS | Status: AC
  Filled 2018-06-19: qty 80

## 2018-06-19 MED ORDER — PROPOFOL 10 MG/ML IV BOLUS
INTRAVENOUS | Status: DC | PRN
  Administered 2018-06-19: 20 mg via INTRAVENOUS

## 2018-06-19 MED ORDER — ONDANSETRON HCL 4 MG/2ML IJ SOLN: 4.0000 mg | Freq: Once | INTRAMUSCULAR | Status: DC | PRN

## 2018-06-19 MED ORDER — MEPERIDINE HCL 100 MG/ML IJ SOLN: 6.2500 mg | INTRAMUSCULAR | Status: DC | PRN

## 2018-06-19 MED ORDER — CHLORHEXIDINE GLUCONATE CLOTH 2 % EX PADS: 6.0000 | MEDICATED_PAD | Freq: Once | CUTANEOUS | Status: DC

## 2018-06-19 MED ORDER — PROPOFOL 500 MG/50ML IV EMUL
INTRAVENOUS | Status: DC | PRN
  Administered 2018-06-19: 150 ug/kg/min via INTRAVENOUS

## 2018-06-19 MED ORDER — LIDOCAINE HCL (CARDIAC) PF 100 MG/5ML IV SOSY
PREFILLED_SYRINGE | INTRAVENOUS | Status: DC | PRN
  Administered 2018-06-19: 40 mg via INTRAVENOUS

## 2018-06-19 MED ORDER — LACTATED RINGERS IV SOLN
INTRAVENOUS | Status: DC
  Administered 2018-06-19: 1000 mL via INTRAVENOUS

## 2018-06-19 MED ORDER — HYDROCODONE-ACETAMINOPHEN 7.5-325 MG PO TABS: 1.0000 | ORAL_TABLET | Freq: Once | ORAL | Status: DC | PRN

## 2018-06-19 MED ORDER — GLYCOPYRROLATE 0.2 MG/ML IJ SOLN
INTRAMUSCULAR | Status: DC | PRN
  Administered 2018-06-19: .2 mg via INTRAVENOUS

## 2018-06-19 NOTE — Op Note (Signed)
[facility] Patient Name: Mary CharonShelia Norby Procedure Date: 06/19/2018 7:10 AM MRN: [id number] Date of Birth: 1955-12-17 Attending MD: Gennette Pacobert Michael Suheyla Mortellaro , MD CSN: [id number] Age: 63 Admit Type: Outpatient Procedure:                Colonoscopy Indications:              High risk colon cancer surveillance: Personal                            history of colonic polyps Providers:                Gennette Pacobert Michael Sunil Hue, MD, Jannett CelestineAnitra Bell, RN, Dyann Ruddleonya                            Wilson Referring MD:             Catalina PizzaZach Hall, MD Medicines:                Propofol per Anesthesia Complications:            No immediate complications. Estimated Blood Loss:     Estimated blood loss: none. Procedure:                Pre-Anesthesia Assessment:                           - Prior to the procedure, a History and Physical                            was performed, and patient medications and                            allergies were reviewed. The patient's tolerance of                            previous anesthesia was also reviewed. The risks                            and benefits of the procedure and the sedation                            options and risks were discussed with the patient.                            All questions were answered, and informed consent                            was obtained. Prior Anticoagulants: The patient has                            taken no previous anticoagulant or antiplatelet                            agents. ASA Grade Assessment: II - A patient with  mild systemic disease. After reviewing the risks                            and benefits, the patient was deemed in                            satisfactory condition to undergo the procedure.                           After obtaining informed consent, the colonoscope                            was passed under direct vision. Throughout the                            procedure, the patient's  blood pressure, pulse, and                            oxygen saturations were monitored continuously. The                            CF-[id number] ([id number]) scope was introduced through                            the and advanced to the the cecum, identified by                            appendiceal orifice and ileocecal valve. The                            colonoscopy was performed without difficulty. The                            patient tolerated the procedure well. The quality                            of the bowel preparation was adequate. Scope In: 7:34:32 AM Scope Out: 7:45:03 AM Scope Withdrawal Time: 0 hours 6 minutes 41 seconds  Total Procedure Duration: 0 hours 10 minutes 31 seconds  Findings:      The perianal and digital rectal examinations were normal.      The entire examined colon appeared normal on direct and retroflexion       views. Impression:               - The entire examined colon is normal on direct and                            retroflexion views.                           - No specimens collected. Moderate Sedation:      Moderate (conscious) sedation was personally administered by an       anesthesia professional. The following parameters were monitored: oxygen       saturation, heart rate, blood pressure, respiratory rate,  EKG, adequacy       of pulmonary ventilation, and response to care. Recommendation:           - Patient has a contact number available for                            emergencies. The signs and symptoms of potential                            delayed complications were discussed with the                            patient. Return to normal activities tomorrow.                            Written discharge instructions were provided to the                            patient.                           - Advance diet as tolerated.                           - Continue present medications.                           - Repeat colonoscopy in 5  years for surveillance.                           - Return to GI office in 3 months. Procedure Code(s):        --- Professional ---                           [phone], Colonoscopy, flexible; diagnostic, including                            collection of specimen(s) by brushing or washing,                            when performed (separate procedure) Diagnosis Code(s):        --- Professional ---                           Z86.010, Personal history of colonic polyps CPT copyright 2018 American Medical Association. All rights reserved. The codes documented in this report are preliminary and upon coder review may  be revised to meet current compliance requirements. Gerrit Friends. Abdimalik Mayorquin, MD Gennette Pac, MD 06/19/2018 7:49:46 AM This report has been signed electronically. Number of Addenda: 0

## 2018-06-19 NOTE — Anesthesia Postprocedure Evaluation (Signed)
Anesthesia Post Note  Patient: Mary Holden  Procedure(s) Performed: COLONOSCOPY WITH PROPOFOL (N/A )  Patient location during evaluation: PACU Anesthesia Type: MAC Level of consciousness: awake and alert and oriented Pain management: pain level controlled Vital Signs Assessment: post-procedure vital signs reviewed and stable Respiratory status: spontaneous breathing Cardiovascular status: stable Postop Assessment: no apparent nausea or vomiting Anesthetic complications: no     Last Vitals:  Vitals:   06/19/18 0636  BP: 122/70  Resp: 18  Temp: 36.7 C  SpO2: 94%    Last Pain:  Vitals:   06/19/18 0636  TempSrc: Oral  PainSc: 0-No pain                 ADAMS, AMY A

## 2018-06-19 NOTE — Anesthesia Preprocedure Evaluation (Addendum)
Anesthesia Evaluation  Patient identified by MRN, date of birth, ID band Patient awake    Reviewed: Allergy & Precautions, H&P , NPO status , Patient's Chart, lab work & pertinent test results  Airway Mallampati: II  TM Distance: <3 FB Neck ROM: full    Dental no notable dental hx.    Pulmonary neg pulmonary ROS,    Pulmonary exam normal breath sounds clear to auscultation       Cardiovascular Exercise Tolerance: Good hypertension, + Valvular Problems/Murmurs  Rhythm:regular Rate:Normal     Neuro/Psych negative neurological ROS  negative psych ROS   GI/Hepatic Neg liver ROS, GERD  ,  Endo/Other  negative endocrine ROSdiabetes  Renal/GU negative Renal ROS  negative genitourinary   Musculoskeletal   Abdominal   Peds  Hematology  (+) Blood dyscrasia, anemia ,   Anesthesia Other Findings   Reproductive/Obstetrics negative OB ROS                            Anesthesia Physical Anesthesia Plan  ASA: II  Anesthesia Plan: MAC   Post-op Pain Management:    Induction:   PONV Risk Score and Plan:   Airway Management Planned:   Additional Equipment:   Intra-op Plan:   Post-operative Plan:   Informed Consent: I have reviewed the patients History and Physical, chart, labs and discussed the procedure including the risks, benefits and alternatives for the proposed anesthesia with the patient or authorized representative who has indicated his/her understanding and acceptance.     Dental Advisory Given  Plan Discussed with: CRNA  Anesthesia Plan Comments:         Anesthesia Quick Evaluation

## 2018-06-19 NOTE — Discharge Instructions (Signed)
Colonoscopy Discharge Instructions  Read the instructions outlined below and refer to this sheet in the next few weeks. These discharge instructions provide you with general information on caring for yourself after you leave the hospital. Your doctor may also give you specific instructions. While your treatment has been planned according to the most current medical practices available, unavoidable complications occasionally occur. If you have any problems or questions after discharge, call Dr. Jena Gauss at [phone]. ACTIVITY  You may resume your regular activity, but move at a slower pace for the next 24 hours.   Take frequent rest periods for the next 24 hours.   Walking will help get rid of the air and reduce the bloated feeling in your belly (abdomen).   No driving for 24 hours (because of the medicine (anesthesia) used during the test).    Do not sign any important legal documents or operate any machinery for 24 hours (because of the anesthesia used during the test).  NUTRITION  Drink plenty of fluids.   You may resume your normal diet as instructed by your doctor.   Begin with a light meal and progress to your normal diet. Heavy or fried foods are harder to digest and may make you feel sick to your stomach (nauseated).   Avoid alcoholic beverages for 24 hours or as instructed.  MEDICATIONS  You may resume your normal medications unless your doctor tells you otherwise.  WHAT YOU CAN EXPECT TODAY  Some feelings of bloating in the abdomen.   Passage of more gas than usual.   Spotting of blood in your stool or on the toilet paper.  IF YOU HAD POLYPS REMOVED DURING THE COLONOSCOPY:  No aspirin products for 7 days or as instructed.   No alcohol for 7 days or as instructed.   Eat a soft diet for the next 24 hours.  FINDING OUT THE RESULTS OF YOUR TEST Not all test results are available during your visit. If your test results are not back during the visit, make an appointment  with your caregiver to find out the results. Do not assume everything is normal if you have not heard from your caregiver or the medical facility. It is important for you to follow up on all of your test results.  SEEK IMMEDIATE MEDICAL ATTENTION IF:  You have more than a spotting of blood in your stool.   Your belly is swollen (abdominal distention).   You are nauseated or vomiting.   You have a temperature over 101.   You have abdominal pain or discomfort that is severe or gets worse throughout the day.    Repeat colonoscopy in 5 years   Office visit with [place] in 3 months  PATIENT INSTRUCTIONS POST-ANESTHESIA  IMMEDIATELY FOLLOWING SURGERY:  Do not drive or operate machinery for the first twenty four hours after surgery.  Do not make any important decisions for twenty four hours after surgery or while taking narcotic pain medications or sedatives.  If you develop intractable nausea and vomiting or a severe headache please notify your doctor immediately.  FOLLOW-UP:  Please make an appointment with your surgeon as instructed. You do not need to follow up with anesthesia unless specifically instructed to do so.  WOUND CARE INSTRUCTIONS (if applicable):  Keep a dry clean dressing on the anesthesia/puncture wound site if there is drainage.  Once the wound has quit draining you may leave it open to air.  Generally you should leave the bandage intact for twenty four hours unless  there is drainage.  If the epidural site drains for more than 36-48 hours please call the anesthesia department.  QUESTIONS?:  Please feel free to call your physician or the hospital operator if you have any questions, and they will be happy to assist you.       Colonoscopy, Adult, Care After This sheet gives you information about how to care for yourself after your procedure. Your doctor may also give you more specific instructions. If you have problems or questions, call your doctor. What can I expect after the  procedure? After the procedure, it is common to have:  A small amount of blood in your poop for 24 hours.  Some gas.  Mild cramping or bloating in your belly. Follow these instructions at home: General instructions  For the first 24 hours after the procedure: ? Do not drive or use machinery. ? Do not sign important documents. ? Do not drink alcohol. ? Do your daily activities more slowly than normal. ? Eat foods that are soft and easy to digest.  Take over-the-counter or prescription medicines only as told by your doctor. To help cramping and bloating:   Try walking around.  Put heat on your belly (abdomen) as told by your doctor. Use a heat source that your doctor recommends, such as a moist heat pack or a heating pad. ? Put a towel between your skin and the heat source. ? Leave the heat on for 20-30 minutes. ? Remove the heat if your skin turns bright red. This is especially important if you cannot feel pain, heat, or cold. You can get burned. Eating and drinking   Drink enough fluid to keep your pee (urine) clear or pale yellow.  Return to your normal diet as told by your doctor. Avoid heavy or fried foods that are hard to digest.  Avoid drinking alcohol for as long as told by your doctor. Contact a doctor if:  You have blood in your poop (stool) 2-3 days after the procedure. Get help right away if:  You have more than a small amount of blood in your poop.  You see large clumps of tissue (blood clots) in your poop.  Your belly is swollen.  You feel sick to your stomach (nauseous).  You throw up (vomit).  You have a fever.  You have belly pain that gets worse, and medicine does not help your pain. Summary  After the procedure, it is common to have a small amount of blood in your poop. You may also have mild cramping and bloating in your belly.  For the first 24 hours after the procedure, do not drive or use machinery, do not sign important documents, and do  not drink alcohol.  Get help right away if you have a lot of blood in your poop, feel sick to your stomach, have a fever, or have more belly pain. This information is not intended to replace advice given to you by your health care provider. Make sure you discuss any questions you have with your health care provider. Document Released: 06/02/2010 Document Revised: 02/28/2017 Document Reviewed: 01/23/2016 Elsevier Interactive Patient Education  2019 [employer].

## 2018-06-19 NOTE — Interval H&P Note (Signed)
History and Physical Interval Note:  06/19/2018 7:20 AM  Mary Holden  has presented today for surgery, with the diagnosis of history polyps  The various methods of treatment have been discussed with the patient and family. After consideration of risks, benefits and other options for treatment, the patient has consented to  Procedure(s) with comments: COLONOSCOPY WITH PROPOFOL (N/A) - 7:30am as a surgical intervention .  The patient's history has been reviewed, patient examined, no change in status, stable for surgery.  I have reviewed the patient's chart and labs.  Questions were answered to the patient's satisfaction.     Mary Holden  No change.  Surveillance colonoscopy per plan.  The risks, benefits, limitations, alternatives and imponderables have been reviewed with the patient. Questions have been answered. All parties are agreeable.

## 2018-06-19 NOTE — Anesthesia Procedure Notes (Signed)
Procedure Name: MAC Date/Time: 06/19/2018 7:26 AM Performed by: Andree Elk Danny Zimny A, CRNA Pre-anesthesia Checklist: Patient identified, Emergency Drugs available, Suction available, Timeout performed and Patient being monitored Patient Re-evaluated:Patient Re-evaluated prior to induction Oxygen Delivery Method: Non-rebreather mask

## 2018-06-19 NOTE — Transfer of Care (Signed)
Immediate Anesthesia Transfer of Care Note  Patient: Mary Holden  Procedure(s) Performed: COLONOSCOPY WITH PROPOFOL (N/A )  Patient Location: PACU  Anesthesia Type:MAC  Level of Consciousness: awake, alert , oriented and patient cooperative  Airway & Oxygen Therapy: Patient Spontanous Breathing  Post-op Assessment: Report given to RN and Post -op Vital signs reviewed and stable  Post vital signs: Reviewed and stable  Last Vitals:  Vitals Value Taken Time  BP 98/46 06/19/2018  7:50 AM  Temp    Pulse 94 06/19/2018  7:51 AM  Resp 20 06/19/2018  7:51 AM  SpO2 94 % 06/19/2018  7:51 AM  Vitals shown include unvalidated device data.  Last Pain:  Vitals:   06/19/18 0636  TempSrc: Oral  PainSc: 0-No pain         Complications: No apparent anesthesia complications

## 2018-06-25 ENCOUNTER — Encounter (HOSPITAL_COMMUNITY): Payer: Self-pay | Admitting: Internal Medicine

## 2018-07-01 ENCOUNTER — Ambulatory Visit (HOSPITAL_COMMUNITY): Payer: BLUE CROSS/BLUE SHIELD

## 2018-07-01 ENCOUNTER — Ambulatory Visit (HOSPITAL_COMMUNITY)
Admission: RE | Admit: 2018-07-01 | Discharge: 2018-07-01 | Disposition: A | Discharge status: Home / Self Care | Payer: BLUE CROSS/BLUE SHIELD | Source: Ambulatory Visit | Attending: Internal Medicine | Admitting: Internal Medicine

## 2018-07-01 DIAGNOSIS — N6022 Fibroadenosis of left breast: Secondary | ICD-10-CM | POA: Diagnosis present

## 2018-07-01 DIAGNOSIS — N6021 Fibroadenosis of right breast: Secondary | ICD-10-CM | POA: Insufficient documentation

## 2018-08-19 ENCOUNTER — Encounter: Payer: Self-pay | Admitting: *Deleted

## 2018-08-25 ENCOUNTER — Encounter: Payer: Self-pay | Admitting: Gastroenterology

## 2018-08-25 ENCOUNTER — Ambulatory Visit (INDEPENDENT_AMBULATORY_CARE_PROVIDER_SITE_OTHER): Payer: BLUE CROSS/BLUE SHIELD | Admitting: Gastroenterology

## 2018-08-25 ENCOUNTER — Other Ambulatory Visit: Payer: Self-pay

## 2018-08-25 DIAGNOSIS — K219 Gastro-esophageal reflux disease without esophagitis: Secondary | ICD-10-CM | POA: Diagnosis not present

## 2018-08-25 DIAGNOSIS — R748 Abnormal levels of other serum enzymes: Secondary | ICD-10-CM

## 2018-08-25 NOTE — Progress Notes (Signed)
Primary Care Physician:  Celene Squibb, MD  Primary GI: Dr. Gala Romney  Virtual Visit via Telephone Note Due to COVID-19, visit is conducted virtually and was requested by patient. No video capabilities available at time of visit.   I connected with Tillie Rung on 08/25/18 at  1:30 PM EDT by telephone and verified that I am speaking with the correct person using two identifiers.   I discussed the limitations, risks, security and privacy concerns of performing an evaluation and management service by telephone and the availability of in person appointments. I also discussed with the patient that there may be a patient responsible charge related to this service. The patient expressed understanding and agreed to proceed.  Chief Complaint  Patient presents with  . abnormal transaminases     History of Present Illness: 63 year old female with history of elevated LFTs in setting of fatty liver, chronic GERD, history of polyps and due for surveillance 2025.   Jan 2020 labs from PCP: ferritin 50, iron 36, iron sats 9 (low). WBC mildly elevated at 12.5. Hgb 12.3, Hct 36.8, BUN 16, Creatinine 0.91, Alk Phos 53, AST 31, ALT elevated at 62, immunoglobulins all within normal range, alpha-1 antitrypsin normal at 170 phenotype MM, ASMA normal at 17, AMA negative, ANA negative, ceruloplasmin normal at 29. A1c is improved at 6.4%, down from 8 previous check. Likely dealing with fatty liver. Have been unable to retrieve hepatitis panel from PCP. Fatty liver on recent US Jan 2020.   No abdominal pain, N/V, dysphagia, changes in bowel habits. No overt GI bleeding. Continues to make dietary changes. Due for labs in June 2020 she believes.   Past Medical History:  Diagnosis Date  . Anemia   . Arthritis 2015   R knee cortisone injection, has done well   . Diabetes mellitus without complication ([place])   . GERD (gastroesophageal reflux disease)   . Heart murmur   . History of kidney stones   .  Hypercholesterolemia   . Hypertension      Past Surgical History:  Procedure Laterality Date  . BREAST LUMPECTOMY WITH RADIOACTIVE SEED LOCALIZATION Right 11/25/2015   Procedure: RIGHT BREAST LUMPECTOMY WITH RADIOACTIVE SEED LOCALIZATION;  Surgeon: Alphonsa Overall, MD;  Location: [facility];  Service: General;  Laterality: Right;  . COLONOSCOPY  2010   Adenomatous colon polyps  . COLONOSCOPY WITH PROPOFOL N/A 06/19/2018   normal. 5 year surveillance  . TONSILLECTOMY  age 12  . WISDOM TOOTH EXTRACTION  2001     Current Meds  Medication Sig  . aspirin 81 MG tablet Take 81 mg by mouth at bedtime.  . diclofenac (VOLTAREN) 75 MG EC tablet Take 75 mg by mouth 2 (two) times daily as needed for moderate pain.   Marland Kitchen Ertugliflozin L-PyroglutamicAc (STEGLATRO) 15 MG TABS Take 15 mg by mouth daily.   . famotidine (PEPCID) 20 MG tablet Take 20 mg by mouth 2 (two) times daily.  Marland Kitchen JANUMET XR 50-1000 MG TB24 Take 1 tablet by mouth 2 (two) times daily.  Marland Kitchen lisinopril (PRINIVIL,ZESTRIL) 20 MG tablet Take 20 mg by mouth every morning.  . loratadine (CLARITIN) 10 MG tablet Take 10 mg by mouth daily.   . montelukast (SINGULAIR) 10 MG tablet Take 10 mg by mouth at bedtime.  . pravastatin (PRAVACHOL) 20 MG tablet Take 20 mg by mouth daily.       Observations/Objective: No distress. Unable to perform physical exam due to telephone encounter. No video available.  Assessment and Plan: 63 year old female with history of elevated transaminases due to fatty liver, continuing to make dietary and behavior changes. Will recheck HFP in June 2020 and see her back in 6-8 months. I am also requesting hepatitis panel for our records, as we still have not received this from PCP.   Follow Up Instructions: Continue making healthy diet choices and exercise!  We will send a letter regarding blood work to be completed in June 2020. We will be rechecking your liver numbers then.   We will see you back in 6-8 months!    I  discussed the assessment and treatment plan with the patient. The patient was provided an opportunity to ask questions and all were answered. The patient agreed with the plan and demonstrated an understanding of the instructions.   The patient was advised to call back or seek an in-person evaluation if the symptoms worsen or if the condition fails to improve as anticipated.  I provided 8 minutes of non-face-to-face time during this encounter.  Annitta Needs, PhD, ANP-BC [facility]

## 2018-08-25 NOTE — Patient Instructions (Signed)
Continue making healthy diet choices and exercise!  We will send a letter regarding blood work to be completed in June 2020. We will be rechecking your liver numbers then.   We will see you back in 6-8 months!  I enjoyed talking with you again today! As you know, I value our relationship and want to provide genuine, compassionate, and quality care. I welcome your feedback. If you receive a survey regarding your visit,  I greatly appreciate you taking time to fill this out. See you next time!  Gelene Mink, PhD, ANP-BC [facility]

## 2018-08-26 ENCOUNTER — Encounter: Payer: Self-pay | Admitting: Internal Medicine

## 2018-08-26 NOTE — Progress Notes (Signed)
cc'ed to pcp °

## 2018-10-07 ENCOUNTER — Other Ambulatory Visit: Payer: Self-pay

## 2018-10-07 DIAGNOSIS — R748 Abnormal levels of other serum enzymes: Secondary | ICD-10-CM

## 2018-10-13 DIAGNOSIS — E1169 Type 2 diabetes mellitus with other specified complication: Secondary | ICD-10-CM | POA: Diagnosis not present

## 2018-10-13 DIAGNOSIS — I10 Essential (primary) hypertension: Secondary | ICD-10-CM | POA: Diagnosis not present

## 2018-10-13 DIAGNOSIS — R945 Abnormal results of liver function studies: Secondary | ICD-10-CM | POA: Diagnosis not present

## 2018-10-13 DIAGNOSIS — E782 Mixed hyperlipidemia: Secondary | ICD-10-CM | POA: Diagnosis not present

## 2018-11-26 ENCOUNTER — Telehealth: Payer: Self-pay | Admitting: Gastroenterology

## 2018-11-26 NOTE — Telephone Encounter (Signed)
Mary Holden: please ask Dr. Juel Burrow office to please send [place] hepatitis labs for patient. We have requested before but never received.

## 2018-11-27 NOTE — Telephone Encounter (Signed)
Requested Hep C labs from PCP

## 2018-12-18 DIAGNOSIS — E1169 Type 2 diabetes mellitus with other specified complication: Secondary | ICD-10-CM | POA: Diagnosis not present

## 2018-12-18 DIAGNOSIS — E782 Mixed hyperlipidemia: Secondary | ICD-10-CM | POA: Diagnosis not present

## 2018-12-18 DIAGNOSIS — I10 Essential (primary) hypertension: Secondary | ICD-10-CM | POA: Diagnosis not present

## 2018-12-18 DIAGNOSIS — R945 Abnormal results of liver function studies: Secondary | ICD-10-CM | POA: Diagnosis not present

## 2019-02-13 DIAGNOSIS — I10 Essential (primary) hypertension: Secondary | ICD-10-CM | POA: Diagnosis not present

## 2019-02-13 DIAGNOSIS — E1169 Type 2 diabetes mellitus with other specified complication: Secondary | ICD-10-CM | POA: Diagnosis not present

## 2019-02-13 DIAGNOSIS — E782 Mixed hyperlipidemia: Secondary | ICD-10-CM | POA: Diagnosis not present

## 2019-02-16 DIAGNOSIS — R945 Abnormal results of liver function studies: Secondary | ICD-10-CM | POA: Diagnosis not present

## 2019-02-16 DIAGNOSIS — E1169 Type 2 diabetes mellitus with other specified complication: Secondary | ICD-10-CM | POA: Diagnosis not present

## 2019-02-16 DIAGNOSIS — I10 Essential (primary) hypertension: Secondary | ICD-10-CM | POA: Diagnosis not present

## 2019-02-16 DIAGNOSIS — Z23 Encounter for immunization: Secondary | ICD-10-CM | POA: Diagnosis not present

## 2019-02-16 DIAGNOSIS — E782 Mixed hyperlipidemia: Secondary | ICD-10-CM | POA: Diagnosis not present

## 2019-02-17 DIAGNOSIS — E1169 Type 2 diabetes mellitus with other specified complication: Secondary | ICD-10-CM | POA: Diagnosis not present

## 2019-02-17 DIAGNOSIS — E782 Mixed hyperlipidemia: Secondary | ICD-10-CM | POA: Diagnosis not present

## 2019-02-17 DIAGNOSIS — I10 Essential (primary) hypertension: Secondary | ICD-10-CM | POA: Diagnosis not present

## 2019-02-17 DIAGNOSIS — R945 Abnormal results of liver function studies: Secondary | ICD-10-CM | POA: Diagnosis not present

## 2019-03-24 ENCOUNTER — Ambulatory Visit: Payer: BLUE CROSS/BLUE SHIELD | Admitting: Gastroenterology

## 2019-04-13 DIAGNOSIS — E1169 Type 2 diabetes mellitus with other specified complication: Secondary | ICD-10-CM | POA: Diagnosis not present

## 2019-04-13 DIAGNOSIS — E782 Mixed hyperlipidemia: Secondary | ICD-10-CM | POA: Diagnosis not present

## 2019-04-13 DIAGNOSIS — I10 Essential (primary) hypertension: Secondary | ICD-10-CM | POA: Diagnosis not present

## 2019-05-26 ENCOUNTER — Ambulatory Visit: Payer: Medicare Other | Attending: Internal Medicine

## 2019-05-26 ENCOUNTER — Other Ambulatory Visit: Payer: Self-pay

## 2019-05-26 DIAGNOSIS — Z20822 Contact with and (suspected) exposure to covid-19: Secondary | ICD-10-CM

## 2019-05-28 LAB — NOVEL CORONAVIRUS, NAA: SARS-CoV-2, NAA: DETECTED — AB

## 2019-05-29 ENCOUNTER — Telehealth: Payer: Self-pay | Admitting: Infectious Diseases

## 2019-05-29 NOTE — Telephone Encounter (Signed)
Called to discuss with patient about Covid symptoms and the use of bamlanivimab, a monoclonal antibody infusion for those with mild to moderate Covid symptoms and at a high risk of hospitalization.  Pt is qualified for this infusion at the [facility] infusion center due to Age > 45 and Diabetes   Message left to call back - mychart message sent.

## 2019-06-01 ENCOUNTER — Other Ambulatory Visit: Payer: Self-pay | Admitting: Infectious Diseases

## 2019-06-01 DIAGNOSIS — U071 COVID-19: Secondary | ICD-10-CM

## 2019-06-01 DIAGNOSIS — E1165 Type 2 diabetes mellitus with hyperglycemia: Secondary | ICD-10-CM

## 2019-06-01 DIAGNOSIS — I10 Essential (primary) hypertension: Secondary | ICD-10-CM

## 2019-06-01 NOTE — Progress Notes (Signed)
  I connected by phone with Mary Holden on 06/01/2019 at 11:06 AM to discuss the potential use of an new treatment for mild to moderate COVID-19 viral infection in non-hospitalized patients.  This patient is a 64 y.o. female that meets the FDA criteria for Emergency Use Authorization of bamlanivimab or casirivimab\imdevimab.  Has a (+) direct SARS-CoV-2 viral test result  Has mild or moderate COVID-19   Is ? 66 years of age and weighs ? 40 kg  Is NOT hospitalized due to COVID-19  Is NOT requiring oxygen therapy or requiring an increase in baseline oxygen flow rate due to COVID-19  Is within 10 days of symptom onset  Has at least one of the high risk factor(s) for progression to severe COVID-19 and/or hospitalization as defined in EUA.  Specific high risk criteria : Diabetes   I have spoken and communicated the following to the patient or parent/caregiver:  1. FDA has authorized the emergency use of bamlanivimab and casirivimab\imdevimab for the treatment of mild to moderate COVID-19 in adults and pediatric patients with positive results of direct SARS-CoV-2 viral testing who are 63 years of age and older weighing at least 40 kg, and who are at high risk for progressing to severe COVID-19 and/or hospitalization.  2. The significant known and potential risks and benefits of bamlanivimab and casirivimab\imdevimab, and the extent to which such potential risks and benefits are unknown.  3. Information on available alternative treatments and the risks and benefits of those alternatives, including clinical trials.  4. Patients treated with bamlanivimab and casirivimab\imdevimab should continue to self-isolate and use infection control measures (e.g., wear mask, isolate, social distance, avoid sharing personal items, clean and disinfect "high touch" surfaces, and frequent handwashing) according to CDC guidelines.   5. The patient or parent/caregiver has the option to accept or refuse  bamlanivimab or casirivimab\imdevimab .  After reviewing this information with the patient, The patient agreed to proceed with receiving the bamlanimivab infusion and will be provided a copy of the Fact sheet prior to receiving the infusion.[name] 06/01/2019 11:06 AM

## 2019-06-01 NOTE — Telephone Encounter (Signed)
She started having symptoms related to COVID last Monday 1/11 evening with cough and congestion. She contracted from her roommate despite quarantine efforts. She knows 2 people that have received this treatment and found that it was helpful for them.   She continues to have cough and fevers that she needs to treat with tylenol. She is taking mucinex-DM for stuffy nose and trying push fluids.  Appt scheduled for bamlanivimab infusion 1/20

## 2019-06-03 ENCOUNTER — Ambulatory Visit (HOSPITAL_COMMUNITY)
Admission: RE | Admit: 2019-06-03 | Discharge: 2019-06-03 | Disposition: A | Discharge status: Home / Self Care | Payer: Medicare Other | Source: Ambulatory Visit | Attending: Pulmonary Disease | Admitting: Pulmonary Disease

## 2019-06-03 DIAGNOSIS — Z23 Encounter for immunization: Secondary | ICD-10-CM | POA: Diagnosis not present

## 2019-06-03 DIAGNOSIS — I10 Essential (primary) hypertension: Secondary | ICD-10-CM | POA: Diagnosis present

## 2019-06-03 DIAGNOSIS — U071 COVID-19: Secondary | ICD-10-CM

## 2019-06-03 DIAGNOSIS — E1165 Type 2 diabetes mellitus with hyperglycemia: Secondary | ICD-10-CM | POA: Diagnosis present

## 2019-06-03 MED ORDER — SODIUM CHLORIDE 0.9 % IV SOLN
INTRAVENOUS | Status: DC | PRN
  Administered 2019-06-03: 250 mL via INTRAVENOUS

## 2019-06-03 MED ORDER — EPINEPHRINE 0.3 MG/0.3ML IJ SOAJ: 0.3000 mg | Freq: Once | INTRAMUSCULAR | Status: DC | PRN

## 2019-06-03 MED ORDER — SODIUM CHLORIDE 0.9 % IV SOLN
700.0000 mg | Freq: Once | INTRAVENOUS | Status: AC
  Administered 2019-06-03: 700 mg via INTRAVENOUS
  Filled 2019-06-03: qty 20

## 2019-06-03 MED ORDER — FAMOTIDINE IN NACL 20-0.9 MG/50ML-% IV SOLN: 20.0000 mg | Freq: Once | INTRAVENOUS | Status: DC | PRN

## 2019-06-03 MED ORDER — ALBUTEROL SULFATE HFA 108 (90 BASE) MCG/ACT IN AERS: 2.0000 | INHALATION_SPRAY | Freq: Once | RESPIRATORY_TRACT | Status: DC | PRN

## 2019-06-03 MED ORDER — METHYLPREDNISOLONE SODIUM SUCC 125 MG IJ SOLR: 125.0000 mg | Freq: Once | INTRAMUSCULAR | Status: DC | PRN

## 2019-06-03 MED ORDER — DIPHENHYDRAMINE HCL 50 MG/ML IJ SOLN: 50.0000 mg | Freq: Once | INTRAMUSCULAR | Status: DC | PRN

## 2019-06-03 NOTE — Progress Notes (Signed)
  Diagnosis: COVID-19  Physician: Dr. Nita Sells  Procedure: Cov[facility] Infusion Clinic Med: bamlanivimab infusion - Provided patient with bamlanimivab fact sheet for patients, parents and caregivers prior to infusion.  Complications: No immediate complications noted.  Discharge: Discharged home   Mary Holden 06/03/2019

## 2019-06-03 NOTE — Discharge Instructions (Signed)
10 Things You Can Do to Manage Your COVID-19 Symptoms at Home If you have possible or confirmed COVID-19: 1. Stay home from work and school. And stay away from other public places. If you must go out, avoid using any kind of public transportation, ridesharing, or taxis. 2. Monitor your symptoms carefully. If your symptoms get worse, call your healthcare provider immediately. 3. Get rest and stay hydrated. 4. If you have a medical appointment, call the healthcare provider ahead of time and tell them that you have or may have COVID-19. 5. For medical emergencies, call 911 and notify the dispatch personnel that you have or may have COVID-19. 6. Cover your cough and sneezes with a tissue or use the inside of your elbow. 7. Wash your hands often with soap and water for at least 20 seconds or clean your hands with an alcohol-based hand sanitizer that contains at least 60% alcohol. 8. As much as possible, stay in a specific room and away from other people in your home. Also, you should use a separate bathroom, if available. If you need to be around other people in or outside of the home, wear a mask. 9. Avoid sharing personal items with other people in your household, like dishes, towels, and bedding. 10. Clean all surfaces that are touched often, like counters, tabletops, and doorknobs. Use household cleaning sprays or wipes according to the label instructions. cdc.gov/coronavirus 11/12/2018 This information is not intended to replace advice given to you by your health care provider. Make sure you discuss any questions you have with your health care provider. Document Revised: 04/16/2019 Document Reviewed: 04/16/2019 Elsevier Patient Education  2020 Elsevier Inc.  

## 2019-06-09 DIAGNOSIS — R0602 Shortness of breath: Secondary | ICD-10-CM | POA: Diagnosis not present

## 2019-06-09 DIAGNOSIS — U071 COVID-19: Secondary | ICD-10-CM | POA: Diagnosis not present

## 2019-06-09 DIAGNOSIS — R600 Localized edema: Secondary | ICD-10-CM | POA: Diagnosis not present

## 2019-06-12 DIAGNOSIS — E1169 Type 2 diabetes mellitus with other specified complication: Secondary | ICD-10-CM | POA: Diagnosis not present

## 2019-06-12 DIAGNOSIS — E782 Mixed hyperlipidemia: Secondary | ICD-10-CM | POA: Diagnosis not present

## 2019-06-12 DIAGNOSIS — I10 Essential (primary) hypertension: Secondary | ICD-10-CM | POA: Diagnosis not present

## 2019-06-24 DIAGNOSIS — E1169 Type 2 diabetes mellitus with other specified complication: Secondary | ICD-10-CM | POA: Diagnosis not present

## 2019-06-24 DIAGNOSIS — E7849 Other hyperlipidemia: Secondary | ICD-10-CM | POA: Diagnosis not present

## 2019-06-24 DIAGNOSIS — I10 Essential (primary) hypertension: Secondary | ICD-10-CM | POA: Diagnosis not present

## 2019-07-06 DIAGNOSIS — E782 Mixed hyperlipidemia: Secondary | ICD-10-CM | POA: Diagnosis not present

## 2019-07-06 DIAGNOSIS — R300 Dysuria: Secondary | ICD-10-CM | POA: Diagnosis not present

## 2019-07-06 DIAGNOSIS — I10 Essential (primary) hypertension: Secondary | ICD-10-CM | POA: Diagnosis not present

## 2019-07-06 DIAGNOSIS — E119 Type 2 diabetes mellitus without complications: Secondary | ICD-10-CM | POA: Diagnosis not present

## 2019-07-06 DIAGNOSIS — J069 Acute upper respiratory infection, unspecified: Secondary | ICD-10-CM | POA: Diagnosis not present

## 2019-07-08 DIAGNOSIS — E782 Mixed hyperlipidemia: Secondary | ICD-10-CM | POA: Diagnosis not present

## 2019-07-08 DIAGNOSIS — I10 Essential (primary) hypertension: Secondary | ICD-10-CM | POA: Diagnosis not present

## 2019-07-08 DIAGNOSIS — R945 Abnormal results of liver function studies: Secondary | ICD-10-CM | POA: Diagnosis not present

## 2019-07-08 DIAGNOSIS — E1169 Type 2 diabetes mellitus with other specified complication: Secondary | ICD-10-CM | POA: Diagnosis not present

## 2019-07-08 DIAGNOSIS — K760 Fatty (change of) liver, not elsewhere classified: Secondary | ICD-10-CM | POA: Diagnosis not present

## 2019-07-09 ENCOUNTER — Other Ambulatory Visit (HOSPITAL_COMMUNITY): Payer: Self-pay | Admitting: Internal Medicine

## 2019-07-09 DIAGNOSIS — Z1231 Encounter for screening mammogram for malignant neoplasm of breast: Secondary | ICD-10-CM

## 2019-07-27 ENCOUNTER — Other Ambulatory Visit: Payer: Self-pay

## 2019-07-27 ENCOUNTER — Ambulatory Visit (HOSPITAL_COMMUNITY)
Admission: RE | Admit: 2019-07-27 | Discharge: 2019-07-27 | Disposition: A | Discharge status: Home / Self Care | Payer: Medicare Other | Source: Ambulatory Visit | Attending: Internal Medicine | Admitting: Internal Medicine

## 2019-07-27 ENCOUNTER — Encounter (HOSPITAL_COMMUNITY): Payer: Self-pay

## 2019-07-27 DIAGNOSIS — Z1231 Encounter for screening mammogram for malignant neoplasm of breast: Secondary | ICD-10-CM

## 2019-08-11 DIAGNOSIS — I10 Essential (primary) hypertension: Secondary | ICD-10-CM | POA: Diagnosis not present

## 2019-08-11 DIAGNOSIS — M5441 Lumbago with sciatica, right side: Secondary | ICD-10-CM | POA: Diagnosis not present

## 2019-08-11 DIAGNOSIS — E1165 Type 2 diabetes mellitus with hyperglycemia: Secondary | ICD-10-CM | POA: Diagnosis not present

## 2019-09-23 DIAGNOSIS — E7849 Other hyperlipidemia: Secondary | ICD-10-CM | POA: Diagnosis not present

## 2019-09-23 DIAGNOSIS — I10 Essential (primary) hypertension: Secondary | ICD-10-CM | POA: Diagnosis not present

## 2019-09-23 DIAGNOSIS — E1169 Type 2 diabetes mellitus with other specified complication: Secondary | ICD-10-CM | POA: Diagnosis not present

## 2019-10-29 DIAGNOSIS — E119 Type 2 diabetes mellitus without complications: Secondary | ICD-10-CM | POA: Diagnosis not present

## 2019-10-29 DIAGNOSIS — E1169 Type 2 diabetes mellitus with other specified complication: Secondary | ICD-10-CM | POA: Diagnosis not present

## 2019-10-29 DIAGNOSIS — B880 Other acariasis: Secondary | ICD-10-CM | POA: Diagnosis not present

## 2019-10-29 DIAGNOSIS — D72829 Elevated white blood cell count, unspecified: Secondary | ICD-10-CM | POA: Diagnosis not present

## 2019-10-29 DIAGNOSIS — E039 Hypothyroidism, unspecified: Secondary | ICD-10-CM | POA: Diagnosis not present

## 2019-11-11 DIAGNOSIS — K760 Fatty (change of) liver, not elsewhere classified: Secondary | ICD-10-CM | POA: Diagnosis not present

## 2019-11-11 DIAGNOSIS — R945 Abnormal results of liver function studies: Secondary | ICD-10-CM | POA: Diagnosis not present

## 2019-11-11 DIAGNOSIS — E1169 Type 2 diabetes mellitus with other specified complication: Secondary | ICD-10-CM | POA: Diagnosis not present

## 2019-11-11 DIAGNOSIS — E782 Mixed hyperlipidemia: Secondary | ICD-10-CM | POA: Diagnosis not present

## 2019-11-11 DIAGNOSIS — I10 Essential (primary) hypertension: Secondary | ICD-10-CM | POA: Diagnosis not present

## 2020-01-06 DIAGNOSIS — I10 Essential (primary) hypertension: Secondary | ICD-10-CM | POA: Diagnosis not present

## 2020-01-06 DIAGNOSIS — E1169 Type 2 diabetes mellitus with other specified complication: Secondary | ICD-10-CM | POA: Diagnosis not present

## 2020-01-06 DIAGNOSIS — E7849 Other hyperlipidemia: Secondary | ICD-10-CM | POA: Diagnosis not present

## 2020-03-10 DIAGNOSIS — E7849 Other hyperlipidemia: Secondary | ICD-10-CM | POA: Diagnosis not present

## 2020-03-10 DIAGNOSIS — E1169 Type 2 diabetes mellitus with other specified complication: Secondary | ICD-10-CM | POA: Diagnosis not present

## 2020-03-10 DIAGNOSIS — I10 Essential (primary) hypertension: Secondary | ICD-10-CM | POA: Diagnosis not present

## 2020-03-24 ENCOUNTER — Ambulatory Visit: Payer: Medicare Other | Attending: Internal Medicine

## 2020-03-24 DIAGNOSIS — E1169 Type 2 diabetes mellitus with other specified complication: Secondary | ICD-10-CM | POA: Diagnosis not present

## 2020-03-24 DIAGNOSIS — I10 Essential (primary) hypertension: Secondary | ICD-10-CM | POA: Diagnosis not present

## 2020-03-24 DIAGNOSIS — Z23 Encounter for immunization: Secondary | ICD-10-CM

## 2020-03-24 DIAGNOSIS — E7849 Other hyperlipidemia: Secondary | ICD-10-CM | POA: Diagnosis not present

## 2020-03-24 NOTE — Progress Notes (Signed)
   Covid-19 Vaccination Clinic  Name:  Mary Holden    MRN: [id number] DOB: 1955-06-04  03/24/2020  [name] was observed post Covid-19 immunization for 15 minutes without incident. She was provided with Vaccine Information Sheet and instruction to access the V-Safe system.   [name] was instructed to call 911 with any severe reactions post vaccine: Marland Kitchen Difficulty breathing  . Swelling of face and throat  . A fast heartbeat  . A bad rash all over body  . Dizziness and weakness

## 2020-04-06 DIAGNOSIS — E782 Mixed hyperlipidemia: Secondary | ICD-10-CM | POA: Diagnosis not present

## 2020-04-06 DIAGNOSIS — E119 Type 2 diabetes mellitus without complications: Secondary | ICD-10-CM | POA: Diagnosis not present

## 2020-04-06 DIAGNOSIS — J069 Acute upper respiratory infection, unspecified: Secondary | ICD-10-CM | POA: Diagnosis not present

## 2020-04-06 DIAGNOSIS — I10 Essential (primary) hypertension: Secondary | ICD-10-CM | POA: Diagnosis not present

## 2020-04-18 DIAGNOSIS — I10 Essential (primary) hypertension: Secondary | ICD-10-CM | POA: Diagnosis not present

## 2020-04-18 DIAGNOSIS — E1169 Type 2 diabetes mellitus with other specified complication: Secondary | ICD-10-CM | POA: Diagnosis not present

## 2020-04-18 DIAGNOSIS — E782 Mixed hyperlipidemia: Secondary | ICD-10-CM | POA: Diagnosis not present

## 2020-04-18 DIAGNOSIS — R945 Abnormal results of liver function studies: Secondary | ICD-10-CM | POA: Diagnosis not present

## 2020-04-18 DIAGNOSIS — Z23 Encounter for immunization: Secondary | ICD-10-CM | POA: Diagnosis not present

## 2020-05-13 DIAGNOSIS — E782 Mixed hyperlipidemia: Secondary | ICD-10-CM | POA: Diagnosis not present

## 2020-05-13 DIAGNOSIS — E1169 Type 2 diabetes mellitus with other specified complication: Secondary | ICD-10-CM | POA: Diagnosis not present

## 2020-05-13 DIAGNOSIS — I10 Essential (primary) hypertension: Secondary | ICD-10-CM | POA: Diagnosis not present

## 2020-05-13 DIAGNOSIS — K760 Fatty (change of) liver, not elsewhere classified: Secondary | ICD-10-CM | POA: Diagnosis not present

## 2020-05-24 DIAGNOSIS — M5441 Lumbago with sciatica, right side: Secondary | ICD-10-CM | POA: Diagnosis not present

## 2020-06-11 DIAGNOSIS — N3281 Overactive bladder: Secondary | ICD-10-CM | POA: Diagnosis not present

## 2020-06-11 DIAGNOSIS — I10 Essential (primary) hypertension: Secondary | ICD-10-CM | POA: Diagnosis not present

## 2020-06-11 DIAGNOSIS — K760 Fatty (change of) liver, not elsewhere classified: Secondary | ICD-10-CM | POA: Diagnosis not present

## 2020-06-11 DIAGNOSIS — Z8616 Personal history of COVID-19: Secondary | ICD-10-CM | POA: Diagnosis not present

## 2020-06-11 DIAGNOSIS — E782 Mixed hyperlipidemia: Secondary | ICD-10-CM | POA: Diagnosis not present

## 2020-06-11 DIAGNOSIS — E1169 Type 2 diabetes mellitus with other specified complication: Secondary | ICD-10-CM | POA: Diagnosis not present

## 2020-06-11 DIAGNOSIS — Z23 Encounter for immunization: Secondary | ICD-10-CM | POA: Diagnosis not present

## 2020-07-05 ENCOUNTER — Other Ambulatory Visit (HOSPITAL_COMMUNITY): Payer: Self-pay | Admitting: Internal Medicine

## 2020-07-05 DIAGNOSIS — Z1231 Encounter for screening mammogram for malignant neoplasm of breast: Secondary | ICD-10-CM

## 2020-07-11 DIAGNOSIS — I10 Essential (primary) hypertension: Secondary | ICD-10-CM | POA: Diagnosis not present

## 2020-07-11 DIAGNOSIS — E119 Type 2 diabetes mellitus without complications: Secondary | ICD-10-CM | POA: Diagnosis not present

## 2020-07-11 DIAGNOSIS — E782 Mixed hyperlipidemia: Secondary | ICD-10-CM | POA: Diagnosis not present

## 2020-07-15 DIAGNOSIS — M5441 Lumbago with sciatica, right side: Secondary | ICD-10-CM | POA: Diagnosis not present

## 2020-07-29 ENCOUNTER — Ambulatory Visit (HOSPITAL_COMMUNITY)
Admission: RE | Admit: 2020-07-29 | Discharge: 2020-07-29 | Disposition: A | Discharge status: Home / Self Care | Payer: Medicare Other | Source: Ambulatory Visit | Attending: Internal Medicine | Admitting: Internal Medicine

## 2020-07-29 ENCOUNTER — Other Ambulatory Visit: Payer: Self-pay

## 2020-07-29 DIAGNOSIS — Z1231 Encounter for screening mammogram for malignant neoplasm of breast: Secondary | ICD-10-CM | POA: Insufficient documentation

## 2020-08-15 DIAGNOSIS — H524 Presbyopia: Secondary | ICD-10-CM | POA: Diagnosis not present

## 2020-08-15 DIAGNOSIS — H40053 Ocular hypertension, bilateral: Secondary | ICD-10-CM | POA: Diagnosis not present

## 2020-08-24 ENCOUNTER — Other Ambulatory Visit (HOSPITAL_COMMUNITY): Payer: Self-pay | Admitting: Family Medicine

## 2020-08-24 ENCOUNTER — Other Ambulatory Visit: Payer: Self-pay | Admitting: Family Medicine

## 2020-08-24 DIAGNOSIS — M79604 Pain in right leg: Secondary | ICD-10-CM

## 2020-08-24 DIAGNOSIS — M25551 Pain in right hip: Secondary | ICD-10-CM

## 2020-08-24 DIAGNOSIS — M545 Low back pain, unspecified: Secondary | ICD-10-CM

## 2020-08-29 DIAGNOSIS — E782 Mixed hyperlipidemia: Secondary | ICD-10-CM | POA: Diagnosis not present

## 2020-08-29 DIAGNOSIS — I10 Essential (primary) hypertension: Secondary | ICD-10-CM | POA: Diagnosis not present

## 2020-08-29 DIAGNOSIS — E1169 Type 2 diabetes mellitus with other specified complication: Secondary | ICD-10-CM | POA: Diagnosis not present

## 2020-09-01 DIAGNOSIS — I10 Essential (primary) hypertension: Secondary | ICD-10-CM | POA: Diagnosis not present

## 2020-09-01 DIAGNOSIS — K760 Fatty (change of) liver, not elsewhere classified: Secondary | ICD-10-CM | POA: Diagnosis not present

## 2020-09-01 DIAGNOSIS — D509 Iron deficiency anemia, unspecified: Secondary | ICD-10-CM | POA: Diagnosis not present

## 2020-09-01 DIAGNOSIS — E782 Mixed hyperlipidemia: Secondary | ICD-10-CM | POA: Diagnosis not present

## 2020-09-01 DIAGNOSIS — M5431 Sciatica, right side: Secondary | ICD-10-CM | POA: Diagnosis not present

## 2020-09-01 DIAGNOSIS — M25551 Pain in right hip: Secondary | ICD-10-CM | POA: Diagnosis not present

## 2020-09-01 DIAGNOSIS — E1169 Type 2 diabetes mellitus with other specified complication: Secondary | ICD-10-CM | POA: Diagnosis not present

## 2020-09-01 DIAGNOSIS — R945 Abnormal results of liver function studies: Secondary | ICD-10-CM | POA: Diagnosis not present

## 2020-09-01 DIAGNOSIS — M79604 Pain in right leg: Secondary | ICD-10-CM | POA: Diagnosis not present

## 2020-09-01 DIAGNOSIS — N3281 Overactive bladder: Secondary | ICD-10-CM | POA: Diagnosis not present

## 2020-09-01 DIAGNOSIS — Z8616 Personal history of COVID-19: Secondary | ICD-10-CM | POA: Diagnosis not present

## 2020-09-11 DIAGNOSIS — E1165 Type 2 diabetes mellitus with hyperglycemia: Secondary | ICD-10-CM | POA: Diagnosis not present

## 2020-09-11 DIAGNOSIS — I10 Essential (primary) hypertension: Secondary | ICD-10-CM | POA: Diagnosis not present

## 2020-09-13 ENCOUNTER — Other Ambulatory Visit: Payer: Self-pay

## 2020-09-13 ENCOUNTER — Ambulatory Visit (HOSPITAL_COMMUNITY)
Admission: RE | Admit: 2020-09-13 | Discharge: 2020-09-13 | Disposition: A | Discharge status: Home / Self Care | Payer: Medicare Other | Source: Ambulatory Visit | Attending: Family Medicine | Admitting: Family Medicine

## 2020-09-13 DIAGNOSIS — M25551 Pain in right hip: Secondary | ICD-10-CM | POA: Diagnosis not present

## 2020-09-13 DIAGNOSIS — M79604 Pain in right leg: Secondary | ICD-10-CM | POA: Diagnosis not present

## 2020-09-13 DIAGNOSIS — M545 Low back pain, unspecified: Secondary | ICD-10-CM | POA: Insufficient documentation

## 2020-09-19 DIAGNOSIS — M545 Low back pain, unspecified: Secondary | ICD-10-CM | POA: Diagnosis not present

## 2020-10-21 DIAGNOSIS — M545 Low back pain, unspecified: Secondary | ICD-10-CM | POA: Diagnosis not present

## 2020-10-24 DIAGNOSIS — E1165 Type 2 diabetes mellitus with hyperglycemia: Secondary | ICD-10-CM | POA: Diagnosis not present

## 2020-10-24 DIAGNOSIS — I10 Essential (primary) hypertension: Secondary | ICD-10-CM | POA: Diagnosis not present

## 2020-12-11 DIAGNOSIS — E1165 Type 2 diabetes mellitus with hyperglycemia: Secondary | ICD-10-CM | POA: Diagnosis not present

## 2020-12-11 DIAGNOSIS — I10 Essential (primary) hypertension: Secondary | ICD-10-CM | POA: Diagnosis not present

## 2020-12-28 DIAGNOSIS — I10 Essential (primary) hypertension: Secondary | ICD-10-CM | POA: Diagnosis not present

## 2021-01-05 DIAGNOSIS — R809 Proteinuria, unspecified: Secondary | ICD-10-CM | POA: Diagnosis not present

## 2021-01-05 DIAGNOSIS — N3281 Overactive bladder: Secondary | ICD-10-CM | POA: Diagnosis not present

## 2021-01-05 DIAGNOSIS — E782 Mixed hyperlipidemia: Secondary | ICD-10-CM | POA: Diagnosis not present

## 2021-01-05 DIAGNOSIS — E875 Hyperkalemia: Secondary | ICD-10-CM | POA: Diagnosis not present

## 2021-01-05 DIAGNOSIS — E1169 Type 2 diabetes mellitus with other specified complication: Secondary | ICD-10-CM | POA: Diagnosis not present

## 2021-01-05 DIAGNOSIS — M79604 Pain in right leg: Secondary | ICD-10-CM | POA: Diagnosis not present

## 2021-01-05 DIAGNOSIS — K760 Fatty (change of) liver, not elsewhere classified: Secondary | ICD-10-CM | POA: Diagnosis not present

## 2021-01-05 DIAGNOSIS — I10 Essential (primary) hypertension: Secondary | ICD-10-CM | POA: Diagnosis not present

## 2021-01-05 DIAGNOSIS — D509 Iron deficiency anemia, unspecified: Secondary | ICD-10-CM | POA: Diagnosis not present

## 2021-01-05 DIAGNOSIS — R944 Abnormal results of kidney function studies: Secondary | ICD-10-CM | POA: Diagnosis not present

## 2021-01-05 DIAGNOSIS — Z0001 Encounter for general adult medical examination with abnormal findings: Secondary | ICD-10-CM | POA: Diagnosis not present

## 2021-01-11 DIAGNOSIS — I10 Essential (primary) hypertension: Secondary | ICD-10-CM | POA: Diagnosis not present

## 2021-01-11 DIAGNOSIS — E1165 Type 2 diabetes mellitus with hyperglycemia: Secondary | ICD-10-CM | POA: Diagnosis not present

## 2021-02-10 DIAGNOSIS — E1165 Type 2 diabetes mellitus with hyperglycemia: Secondary | ICD-10-CM | POA: Diagnosis not present

## 2021-02-10 DIAGNOSIS — I10 Essential (primary) hypertension: Secondary | ICD-10-CM | POA: Diagnosis not present

## 2021-02-14 DIAGNOSIS — H40053 Ocular hypertension, bilateral: Secondary | ICD-10-CM | POA: Diagnosis not present

## 2021-03-13 DIAGNOSIS — E1165 Type 2 diabetes mellitus with hyperglycemia: Secondary | ICD-10-CM | POA: Diagnosis not present

## 2021-03-13 DIAGNOSIS — I10 Essential (primary) hypertension: Secondary | ICD-10-CM | POA: Diagnosis not present

## 2021-04-12 DIAGNOSIS — I10 Essential (primary) hypertension: Secondary | ICD-10-CM | POA: Diagnosis not present

## 2021-04-12 DIAGNOSIS — E1165 Type 2 diabetes mellitus with hyperglycemia: Secondary | ICD-10-CM | POA: Diagnosis not present

## 2021-04-28 DIAGNOSIS — E1169 Type 2 diabetes mellitus with other specified complication: Secondary | ICD-10-CM | POA: Diagnosis not present

## 2021-04-28 DIAGNOSIS — E782 Mixed hyperlipidemia: Secondary | ICD-10-CM | POA: Diagnosis not present

## 2021-05-02 DIAGNOSIS — D509 Iron deficiency anemia, unspecified: Secondary | ICD-10-CM | POA: Diagnosis not present

## 2021-05-02 DIAGNOSIS — E782 Mixed hyperlipidemia: Secondary | ICD-10-CM | POA: Diagnosis not present

## 2021-05-02 DIAGNOSIS — E875 Hyperkalemia: Secondary | ICD-10-CM | POA: Diagnosis not present

## 2021-05-02 DIAGNOSIS — I10 Essential (primary) hypertension: Secondary | ICD-10-CM | POA: Diagnosis not present

## 2021-05-02 DIAGNOSIS — R944 Abnormal results of kidney function studies: Secondary | ICD-10-CM | POA: Diagnosis not present

## 2021-05-02 DIAGNOSIS — K760 Fatty (change of) liver, not elsewhere classified: Secondary | ICD-10-CM | POA: Diagnosis not present

## 2021-05-02 DIAGNOSIS — N3281 Overactive bladder: Secondary | ICD-10-CM | POA: Diagnosis not present

## 2021-05-02 DIAGNOSIS — M79604 Pain in right leg: Secondary | ICD-10-CM | POA: Diagnosis not present

## 2021-05-02 DIAGNOSIS — R809 Proteinuria, unspecified: Secondary | ICD-10-CM | POA: Diagnosis not present

## 2021-05-02 DIAGNOSIS — E1169 Type 2 diabetes mellitus with other specified complication: Secondary | ICD-10-CM | POA: Diagnosis not present

## 2021-05-12 DIAGNOSIS — I10 Essential (primary) hypertension: Secondary | ICD-10-CM | POA: Diagnosis not present

## 2021-05-12 DIAGNOSIS — E1165 Type 2 diabetes mellitus with hyperglycemia: Secondary | ICD-10-CM | POA: Diagnosis not present

## 2021-06-13 DIAGNOSIS — I10 Essential (primary) hypertension: Secondary | ICD-10-CM | POA: Diagnosis not present

## 2021-06-13 DIAGNOSIS — E782 Mixed hyperlipidemia: Secondary | ICD-10-CM | POA: Diagnosis not present

## 2021-06-27 ENCOUNTER — Other Ambulatory Visit (HOSPITAL_COMMUNITY): Payer: Self-pay | Admitting: Internal Medicine

## 2021-06-27 DIAGNOSIS — Z1231 Encounter for screening mammogram for malignant neoplasm of breast: Secondary | ICD-10-CM

## 2021-07-31 ENCOUNTER — Other Ambulatory Visit: Payer: Self-pay

## 2021-07-31 ENCOUNTER — Ambulatory Visit (HOSPITAL_COMMUNITY)
Admission: RE | Admit: 2021-07-31 | Discharge: 2021-07-31 | Disposition: A | Discharge status: Home / Self Care | Payer: No Typology Code available for payment source | Source: Ambulatory Visit | Attending: Internal Medicine | Admitting: Internal Medicine

## 2021-07-31 DIAGNOSIS — Z1231 Encounter for screening mammogram for malignant neoplasm of breast: Secondary | ICD-10-CM | POA: Insufficient documentation

## 2021-08-17 DIAGNOSIS — H524 Presbyopia: Secondary | ICD-10-CM | POA: Diagnosis not present

## 2021-08-23 DIAGNOSIS — E782 Mixed hyperlipidemia: Secondary | ICD-10-CM | POA: Diagnosis not present

## 2021-08-23 DIAGNOSIS — E1169 Type 2 diabetes mellitus with other specified complication: Secondary | ICD-10-CM | POA: Diagnosis not present

## 2021-08-28 DIAGNOSIS — R040 Epistaxis: Secondary | ICD-10-CM | POA: Diagnosis not present

## 2021-08-28 DIAGNOSIS — E1169 Type 2 diabetes mellitus with other specified complication: Secondary | ICD-10-CM | POA: Diagnosis not present

## 2021-08-28 DIAGNOSIS — N3281 Overactive bladder: Secondary | ICD-10-CM | POA: Diagnosis not present

## 2021-08-28 DIAGNOSIS — E782 Mixed hyperlipidemia: Secondary | ICD-10-CM | POA: Diagnosis not present

## 2021-08-28 DIAGNOSIS — R809 Proteinuria, unspecified: Secondary | ICD-10-CM | POA: Diagnosis not present

## 2021-08-28 DIAGNOSIS — M79604 Pain in right leg: Secondary | ICD-10-CM | POA: Diagnosis not present

## 2021-08-28 DIAGNOSIS — I10 Essential (primary) hypertension: Secondary | ICD-10-CM | POA: Diagnosis not present

## 2021-08-28 DIAGNOSIS — K760 Fatty (change of) liver, not elsewhere classified: Secondary | ICD-10-CM | POA: Diagnosis not present

## 2021-08-28 DIAGNOSIS — E875 Hyperkalemia: Secondary | ICD-10-CM | POA: Diagnosis not present

## 2021-08-28 DIAGNOSIS — R944 Abnormal results of kidney function studies: Secondary | ICD-10-CM | POA: Diagnosis not present

## 2021-08-28 DIAGNOSIS — D509 Iron deficiency anemia, unspecified: Secondary | ICD-10-CM | POA: Diagnosis not present

## 2021-09-10 DIAGNOSIS — I10 Essential (primary) hypertension: Secondary | ICD-10-CM | POA: Diagnosis not present

## 2021-09-10 DIAGNOSIS — E1169 Type 2 diabetes mellitus with other specified complication: Secondary | ICD-10-CM | POA: Diagnosis not present

## 2021-09-10 DIAGNOSIS — E782 Mixed hyperlipidemia: Secondary | ICD-10-CM | POA: Diagnosis not present

## 2021-10-11 DIAGNOSIS — E1169 Type 2 diabetes mellitus with other specified complication: Secondary | ICD-10-CM | POA: Diagnosis not present

## 2021-10-11 DIAGNOSIS — I10 Essential (primary) hypertension: Secondary | ICD-10-CM | POA: Diagnosis not present

## 2021-10-11 DIAGNOSIS — E782 Mixed hyperlipidemia: Secondary | ICD-10-CM | POA: Diagnosis not present

## 2021-12-11 DIAGNOSIS — E782 Mixed hyperlipidemia: Secondary | ICD-10-CM | POA: Diagnosis not present

## 2021-12-11 DIAGNOSIS — E1169 Type 2 diabetes mellitus with other specified complication: Secondary | ICD-10-CM | POA: Diagnosis not present

## 2021-12-11 DIAGNOSIS — I10 Essential (primary) hypertension: Secondary | ICD-10-CM | POA: Diagnosis not present

## 2021-12-22 DIAGNOSIS — E1169 Type 2 diabetes mellitus with other specified complication: Secondary | ICD-10-CM | POA: Diagnosis not present

## 2021-12-22 DIAGNOSIS — E782 Mixed hyperlipidemia: Secondary | ICD-10-CM | POA: Diagnosis not present

## 2021-12-28 DIAGNOSIS — E1169 Type 2 diabetes mellitus with other specified complication: Secondary | ICD-10-CM | POA: Diagnosis not present

## 2021-12-28 DIAGNOSIS — R040 Epistaxis: Secondary | ICD-10-CM | POA: Diagnosis not present

## 2021-12-28 DIAGNOSIS — K760 Fatty (change of) liver, not elsewhere classified: Secondary | ICD-10-CM | POA: Diagnosis not present

## 2021-12-28 DIAGNOSIS — R944 Abnormal results of kidney function studies: Secondary | ICD-10-CM | POA: Diagnosis not present

## 2021-12-28 DIAGNOSIS — R809 Proteinuria, unspecified: Secondary | ICD-10-CM | POA: Diagnosis not present

## 2021-12-28 DIAGNOSIS — E782 Mixed hyperlipidemia: Secondary | ICD-10-CM | POA: Diagnosis not present

## 2021-12-28 DIAGNOSIS — M79604 Pain in right leg: Secondary | ICD-10-CM | POA: Diagnosis not present

## 2021-12-28 DIAGNOSIS — N3281 Overactive bladder: Secondary | ICD-10-CM | POA: Diagnosis not present

## 2021-12-28 DIAGNOSIS — I10 Essential (primary) hypertension: Secondary | ICD-10-CM | POA: Diagnosis not present

## 2021-12-28 DIAGNOSIS — D509 Iron deficiency anemia, unspecified: Secondary | ICD-10-CM | POA: Diagnosis not present

## 2021-12-28 DIAGNOSIS — E875 Hyperkalemia: Secondary | ICD-10-CM | POA: Diagnosis not present

## 2022-01-11 DIAGNOSIS — E782 Mixed hyperlipidemia: Secondary | ICD-10-CM | POA: Diagnosis not present

## 2022-01-11 DIAGNOSIS — E1169 Type 2 diabetes mellitus with other specified complication: Secondary | ICD-10-CM | POA: Diagnosis not present

## 2022-01-11 DIAGNOSIS — I10 Essential (primary) hypertension: Secondary | ICD-10-CM | POA: Diagnosis not present

## 2022-04-10 DIAGNOSIS — K219 Gastro-esophageal reflux disease without esophagitis: Secondary | ICD-10-CM | POA: Diagnosis not present

## 2022-04-10 DIAGNOSIS — Z6835 Body mass index (BMI) 35.0-35.9, adult: Secondary | ICD-10-CM | POA: Diagnosis not present

## 2022-04-10 DIAGNOSIS — F419 Anxiety disorder, unspecified: Secondary | ICD-10-CM | POA: Diagnosis not present

## 2022-04-10 DIAGNOSIS — E785 Hyperlipidemia, unspecified: Secondary | ICD-10-CM | POA: Diagnosis not present

## 2022-04-10 DIAGNOSIS — Z008 Encounter for other general examination: Secondary | ICD-10-CM | POA: Diagnosis not present

## 2022-04-10 DIAGNOSIS — I129 Hypertensive chronic kidney disease with stage 1 through stage 4 chronic kidney disease, or unspecified chronic kidney disease: Secondary | ICD-10-CM | POA: Diagnosis not present

## 2022-04-10 DIAGNOSIS — E1169 Type 2 diabetes mellitus with other specified complication: Secondary | ICD-10-CM | POA: Diagnosis not present

## 2022-04-10 DIAGNOSIS — N182 Chronic kidney disease, stage 2 (mild): Secondary | ICD-10-CM | POA: Diagnosis not present

## 2022-04-10 DIAGNOSIS — E1122 Type 2 diabetes mellitus with diabetic chronic kidney disease: Secondary | ICD-10-CM | POA: Diagnosis not present

## 2022-04-26 DIAGNOSIS — E1169 Type 2 diabetes mellitus with other specified complication: Secondary | ICD-10-CM | POA: Diagnosis not present

## 2022-04-26 DIAGNOSIS — D509 Iron deficiency anemia, unspecified: Secondary | ICD-10-CM | POA: Diagnosis not present

## 2022-04-26 DIAGNOSIS — E782 Mixed hyperlipidemia: Secondary | ICD-10-CM | POA: Diagnosis not present

## 2022-04-30 DIAGNOSIS — H25813 Combined forms of age-related cataract, bilateral: Secondary | ICD-10-CM | POA: Diagnosis not present

## 2022-04-30 DIAGNOSIS — H01002 Unspecified blepharitis right lower eyelid: Secondary | ICD-10-CM | POA: Diagnosis not present

## 2022-04-30 DIAGNOSIS — H40053 Ocular hypertension, bilateral: Secondary | ICD-10-CM | POA: Diagnosis not present

## 2022-04-30 DIAGNOSIS — H01001 Unspecified blepharitis right upper eyelid: Secondary | ICD-10-CM | POA: Diagnosis not present

## 2022-05-03 DIAGNOSIS — E782 Mixed hyperlipidemia: Secondary | ICD-10-CM | POA: Diagnosis not present

## 2022-05-03 DIAGNOSIS — E1169 Type 2 diabetes mellitus with other specified complication: Secondary | ICD-10-CM | POA: Diagnosis not present

## 2022-05-03 DIAGNOSIS — D509 Iron deficiency anemia, unspecified: Secondary | ICD-10-CM | POA: Diagnosis not present

## 2022-05-03 DIAGNOSIS — E875 Hyperkalemia: Secondary | ICD-10-CM | POA: Diagnosis not present

## 2022-05-03 DIAGNOSIS — Z0001 Encounter for general adult medical examination with abnormal findings: Secondary | ICD-10-CM | POA: Diagnosis not present

## 2022-05-03 DIAGNOSIS — M25511 Pain in right shoulder: Secondary | ICD-10-CM | POA: Diagnosis not present

## 2022-05-03 DIAGNOSIS — R809 Proteinuria, unspecified: Secondary | ICD-10-CM | POA: Diagnosis not present

## 2022-05-03 DIAGNOSIS — N3281 Overactive bladder: Secondary | ICD-10-CM | POA: Diagnosis not present

## 2022-05-03 DIAGNOSIS — R944 Abnormal results of kidney function studies: Secondary | ICD-10-CM | POA: Diagnosis not present

## 2022-05-03 DIAGNOSIS — K760 Fatty (change of) liver, not elsewhere classified: Secondary | ICD-10-CM | POA: Diagnosis not present

## 2022-05-03 DIAGNOSIS — I10 Essential (primary) hypertension: Secondary | ICD-10-CM | POA: Diagnosis not present

## 2022-05-10 DIAGNOSIS — H25811 Combined forms of age-related cataract, right eye: Secondary | ICD-10-CM | POA: Diagnosis not present

## 2022-05-15 ENCOUNTER — Other Ambulatory Visit (HOSPITAL_COMMUNITY): Payer: Self-pay | Admitting: Internal Medicine

## 2022-05-15 DIAGNOSIS — Z1231 Encounter for screening mammogram for malignant neoplasm of breast: Secondary | ICD-10-CM

## 2022-05-15 NOTE — H&P (Signed)
Surgical History & Physical  Patient Name: Mary Holden DOB: 1956-03-27  Surgery: Cataract extraction with intraocular lens implant phacoemulsification; Right Eye  Surgeon: Baruch Goldmann MD Surgery Date:  05-21-22 Pre-Op Date:  04-30-22  HPI: A 62 Yr. old female patient 1. The patient complains of difficulty when viewing TV, reading closed caption, news scrolls on TV/nighttime driving, which began 1 year ago. Both eyes are affected. The episode is gradual. The condition's severity increased since last visit. Symptoms occur when the patient is driving, inside, outside and reading. The complaint is associated with glare. is referred by Dr Rosana Hoes for cataract eval. HPI was performed by Baruch Goldmann .  Medical History: Glaucoma Cataracts Diabetes High Blood Pressure LDL  Review of Systems Respiratory Asthma All recorded systems are negative except as noted above.  Social   Never smoked   Medication Lisinopril, Simvastatin, Singulair, Janumet, Furosemide, Montelukast, Glipizide, Aspirin, Zyrtec, Nexium,   Sx/Procedures Tonsilectomy, Lumpectomy (Breast) R,   Drug Allergies  Codeine,   History & Physical: Heent: cataract, right eye NECK: supple without bruits LUNGS: lungs clear to auscultation CV: regular rate and rhythm Abdomen: soft and non-tender Impression & Plan: Assessment: 1.  COMBINED FORMS AGE RELATED CATARACT; Both Eyes (H25.813) 2.  Ocular Hypertension; Both Eyes (H40.053) 3.  BLEPHARITIS; Right Upper Lid, Right Lower Lid, Left Upper Lid, Left Lower Lid (H01.001, H01.002,H01.004,H01.005) 4.  DERMATOCHALASIS, no surgery; Right Upper Lid, Left Upper Lid (H02.831, H02.834) 5.  Pinguecula; Right Eye (H11.151) 6.  VITREOUS DETACHMENT PVD; Both Eyes (H43.813) 7.  ASTIGMATISM, REGULAR; Both Eyes (H52.223)  Plan: 1.  Cataract accounts for the patient's decreased vision. This visual impairment is not correctable with a tolerable change in glasses or contact lenses.  Cataract surgery with an implantation of a new lens should significantly improve the visual and functional status of the patient. Discussed all risks, benefits, alternatives, and potential complications. Discussed the procedures and recovery. Patient desires to have surgery. A-scan ordered and performed today for intra-ocular lens calculations. The surgery will be performed in order to improve vision for driving, reading, and for eye examinations. Recommend phacoemulsification with intra-ocular lens. Recommend Dextenza for post-operative pain and inflammation. Right Eye worse. Dilates well - shugarcaine by protocol. Recommend Toric Lens. Recommend Vivity Lens. - Toric.  2.  Thick corneas. IOPs upper limit of normal when adjusted for CCT. OCT rNFL WNL OU. Detailed discussion about glaucoma today including importance of maintaining good follow up and following treatment plan, and the possibility of irreversible blindness as part of this disease process.  3.  Recommend regular lid cleaning.  4.  Asymptomatic, recommend observation for now. Findings, prognosis and treatment options reviewed.  5.  Observe; Artificial tears as needed for irritation.  6.  Old Asymptomatic. RD precautions given. Patient to call with increase in flashing lights/floaters/dark curtain. Symptomatic.  7.  Recommend toric IOL OU.

## 2022-05-16 ENCOUNTER — Encounter (HOSPITAL_COMMUNITY): Payer: Self-pay

## 2022-05-16 ENCOUNTER — Encounter (HOSPITAL_COMMUNITY)
Admission: RE | Admit: 2022-05-16 | Discharge: 2022-05-16 | Disposition: A | Discharge status: Home / Self Care | Payer: No Typology Code available for payment source | Source: Ambulatory Visit | Attending: Ophthalmology | Admitting: Ophthalmology

## 2022-05-21 ENCOUNTER — Ambulatory Visit (HOSPITAL_COMMUNITY): Payer: No Typology Code available for payment source | Admitting: Certified Registered Nurse Anesthetist

## 2022-05-21 ENCOUNTER — Encounter (HOSPITAL_COMMUNITY)
Admission: RE | Disposition: A | Discharge status: Home / Self Care | Payer: Self-pay | Source: Ambulatory Visit | Attending: Ophthalmology

## 2022-05-21 ENCOUNTER — Ambulatory Visit (HOSPITAL_COMMUNITY)
Admission: RE | Admit: 2022-05-21 | Discharge: 2022-05-21 | Disposition: A | Discharge status: Home / Self Care | Payer: No Typology Code available for payment source | Source: Ambulatory Visit | Attending: Ophthalmology | Admitting: Ophthalmology

## 2022-05-21 ENCOUNTER — Encounter (HOSPITAL_COMMUNITY): Payer: Self-pay | Admitting: Ophthalmology

## 2022-05-21 DIAGNOSIS — H01005 Unspecified blepharitis left lower eyelid: Secondary | ICD-10-CM | POA: Diagnosis not present

## 2022-05-21 DIAGNOSIS — H01002 Unspecified blepharitis right lower eyelid: Secondary | ICD-10-CM | POA: Insufficient documentation

## 2022-05-21 DIAGNOSIS — I10 Essential (primary) hypertension: Secondary | ICD-10-CM | POA: Insufficient documentation

## 2022-05-21 DIAGNOSIS — H01001 Unspecified blepharitis right upper eyelid: Secondary | ICD-10-CM | POA: Diagnosis not present

## 2022-05-21 DIAGNOSIS — H25811 Combined forms of age-related cataract, right eye: Secondary | ICD-10-CM

## 2022-05-21 DIAGNOSIS — K219 Gastro-esophageal reflux disease without esophagitis: Secondary | ICD-10-CM | POA: Diagnosis not present

## 2022-05-21 DIAGNOSIS — H02831 Dermatochalasis of right upper eyelid: Secondary | ICD-10-CM | POA: Insufficient documentation

## 2022-05-21 DIAGNOSIS — H01004 Unspecified blepharitis left upper eyelid: Secondary | ICD-10-CM | POA: Insufficient documentation

## 2022-05-21 DIAGNOSIS — E119 Type 2 diabetes mellitus without complications: Secondary | ICD-10-CM | POA: Diagnosis not present

## 2022-05-21 DIAGNOSIS — H43813 Vitreous degeneration, bilateral: Secondary | ICD-10-CM | POA: Insufficient documentation

## 2022-05-21 DIAGNOSIS — D649 Anemia, unspecified: Secondary | ICD-10-CM

## 2022-05-21 DIAGNOSIS — Z7984 Long term (current) use of oral hypoglycemic drugs: Secondary | ICD-10-CM

## 2022-05-21 DIAGNOSIS — H52223 Regular astigmatism, bilateral: Secondary | ICD-10-CM | POA: Insufficient documentation

## 2022-05-21 DIAGNOSIS — H02834 Dermatochalasis of left upper eyelid: Secondary | ICD-10-CM | POA: Diagnosis not present

## 2022-05-21 DIAGNOSIS — E1136 Type 2 diabetes mellitus with diabetic cataract: Secondary | ICD-10-CM | POA: Insufficient documentation

## 2022-05-21 DIAGNOSIS — H11151 Pinguecula, right eye: Secondary | ICD-10-CM | POA: Insufficient documentation

## 2022-05-21 DIAGNOSIS — H40053 Ocular hypertension, bilateral: Secondary | ICD-10-CM | POA: Diagnosis not present

## 2022-05-21 HISTORY — PX: CATARACT EXTRACTION W/PHACO: SHX586

## 2022-05-21 LAB — GLUCOSE, CAPILLARY: Glucose-Capillary: 151 mg/dL — ABNORMAL HIGH (ref 70–99)

## 2022-05-21 SURGERY — PHACOEMULSIFICATION, CATARACT, WITH IOL INSERTION
Anesthesia: Monitor Anesthesia Care | Site: Eye | Laterality: Right

## 2022-05-21 MED ORDER — SODIUM HYALURONATE 10 MG/ML IO SOLUTION
PREFILLED_SYRINGE | INTRAOCULAR | Status: DC | PRN
  Administered 2022-05-21: .85 mL via INTRAOCULAR

## 2022-05-21 MED ORDER — TETRACAINE HCL 0.5 % OP SOLN
1.0000 [drp] | OPHTHALMIC | Status: AC | PRN
  Administered 2022-05-21 (×3): 1 [drp] via OPHTHALMIC

## 2022-05-21 MED ORDER — SODIUM HYALURONATE 23MG/ML IO SOSY
PREFILLED_SYRINGE | INTRAOCULAR | Status: DC | PRN
  Administered 2022-05-21: .6 mL via INTRAOCULAR

## 2022-05-21 MED ORDER — EPINEPHRINE PF 1 MG/ML IJ SOLN
INTRAOCULAR | Status: DC | PRN
  Administered 2022-05-21: 500 mL

## 2022-05-21 MED ORDER — STERILE WATER FOR IRRIGATION IR SOLN
Status: DC | PRN
  Administered 2022-05-21: 250 mL

## 2022-05-21 MED ORDER — POVIDONE-IODINE 5 % OP SOLN
OPHTHALMIC | Status: DC | PRN
  Administered 2022-05-21: 1 via OPHTHALMIC

## 2022-05-21 MED ORDER — MOXIFLOXACIN HCL 0.5 % OP SOLN
OPHTHALMIC | Status: DC | PRN
  Administered 2022-05-21: .2 mL via OPHTHALMIC

## 2022-05-21 MED ORDER — BSS IO SOLN
INTRAOCULAR | Status: DC | PRN
  Administered 2022-05-21: 15 mL via INTRAOCULAR

## 2022-05-21 MED ORDER — LIDOCAINE HCL (PF) 1 % IJ SOLN
INTRAOCULAR | Status: DC | PRN
  Administered 2022-05-21: 1 mL via OPHTHALMIC

## 2022-05-21 MED ORDER — MOXIFLOXACIN HCL 5 MG/ML IO SOLN
INTRAOCULAR | Status: AC
  Filled 2022-05-21: qty 1

## 2022-05-21 MED ORDER — TROPICAMIDE 1 % OP SOLN
1.0000 [drp] | OPHTHALMIC | Status: AC | PRN
  Administered 2022-05-21 (×3): 1 [drp] via OPHTHALMIC

## 2022-05-21 MED ORDER — PHENYLEPHRINE HCL 2.5 % OP SOLN
1.0000 [drp] | OPHTHALMIC | Status: AC | PRN
  Administered 2022-05-21 (×3): 1 [drp] via OPHTHALMIC

## 2022-05-21 MED ORDER — LIDOCAINE HCL 3.5 % OP GEL
1.0000 | Freq: Once | OPHTHALMIC | Status: AC
  Administered 2022-05-21: 1 via OPHTHALMIC

## 2022-05-21 SURGICAL SUPPLY — 15 items
CATARACT SUITE SIGHTPATH (MISCELLANEOUS) ×1 IMPLANT
CLOTH BEACON ORANGE TIMEOUT ST (SAFETY) ×2 IMPLANT
EYE SHIELD UNIVERSAL CLEAR (GAUZE/BANDAGES/DRESSINGS) IMPLANT
FEE CATARACT SUITE SIGHTPATH (MISCELLANEOUS) ×2 IMPLANT
GLOVE BIOGEL PI IND STRL 7.0 (GLOVE) ×4 IMPLANT
LENS IOL RAYNER 17.0 (Intraocular Lens) ×1 IMPLANT
LENS IOL RAYONE EMV 17.0 (Intraocular Lens) IMPLANT
NDL HYPO 18GX1.5 BLUNT FILL (NEEDLE) ×2 IMPLANT
NEEDLE HYPO 18GX1.5 BLUNT FILL (NEEDLE) ×1 IMPLANT
PAD ARMBOARD 7.5X6 YLW CONV (MISCELLANEOUS) ×2 IMPLANT
RING MALYGIN 7.0 (MISCELLANEOUS) IMPLANT
SYR TB 1ML LL NO SAFETY (SYRINGE) ×2 IMPLANT
TAPE SURG TRANSPORE 1 IN (GAUZE/BANDAGES/DRESSINGS) IMPLANT
TAPE SURGICAL TRANSPORE 1 IN (GAUZE/BANDAGES/DRESSINGS) ×1
WATER STERILE IRR 250ML POUR (IV SOLUTION) ×2 IMPLANT

## 2022-05-21 NOTE — Discharge Instructions (Addendum)
Please discharge patient when stable, will follow up today with Dr. Wrzosek at the [facility] [place] office immediately following discharge.  Leave shield in place until visit.  All paperwork with discharge instructions will be given at the office.  [facility] Address:  [address]  [place], [place] [address]  

## 2022-05-21 NOTE — Interval H&P Note (Signed)
History and Physical Interval Note:  05/21/2022 9:37 AM  Mary Holden  has presented today for surgery, with the diagnosis of combined forms age related cataract; right.  The various methods of treatment have been discussed with the patient and family. After consideration of risks, benefits and other options for treatment, the patient has consented to  Procedure(s) with comments: CATARACT EXTRACTION PHACO AND INTRAOCULAR LENS PLACEMENT (IOC) (Right) - right as a surgical intervention.  The patient's history has been reviewed, patient examined, no change in status, stable for surgery.  I have reviewed the patient's chart and labs.  Questions were answered to the patient's satisfaction.     Baruch Goldmann

## 2022-05-21 NOTE — Transfer of Care (Signed)
Immediate Anesthesia Transfer of Care Note  Patient: Mary Holden  Procedure(s) Performed: CATARACT EXTRACTION PHACO AND INTRAOCULAR LENS PLACEMENT (IOC) (Right: Eye)  Patient Location: PACU  Anesthesia Type:MAC  Level of Consciousness: awake, alert , and oriented  Airway & Oxygen Therapy: Patient Spontanous Breathing  Post-op Assessment: Report given to RN and Post -op Vital signs reviewed and stable  Post vital signs: Reviewed and stable  Last Vitals:  Vitals Value Taken Time  BP    Temp    Pulse    Resp    SpO2      Last Pain:  Vitals:   05/21/22 0913  PainSc: 0-No pain         Complications: No notable events documented.

## 2022-05-21 NOTE — Op Note (Signed)
Date of procedure: 05/21/22  Pre-operative diagnosis:  Visually significant combined form age-related cataract, Right Eye (H25.811)  Post-operative diagnosis:  Visually significant combined form age-related cataract, Right Eye (H25.811)  Procedure: Removal of cataract via phacoemulsification and insertion of intra-ocular lens Rayner [id number] +17.0D into the capsular bag of the Right Eye  Attending surgeon: Gerda Diss. Elmo Shumard, MD, MA  Anesthesia: MAC, Topical Akten  Complications: None  Estimated Blood Loss: <767m (minimal)  Specimens: None  Implants: As above  Indications:  Visually significant age-related cataract, Right Eye  Procedure:  The patient was seen and identified in the pre-operative area. The operative eye was identified and dilated.  The operative eye was marked.  Topical anesthesia was administered to the operative eye.     The patient was then to the operative suite and placed in the supine position.  A timeout was performed confirming the patient, procedure to be performed, and all other relevant information.   The patient's face was prepped and draped in the usual fashion for intra-ocular surgery.  A lid speculum was placed into the operative eye and the surgical microscope moved into place and focused.  A superotemporal paracentesis was created using a 20 gauge paracentesis blade.  Shugarcaine was injected into the anterior chamber.  Viscoelastic was injected into the anterior chamber.  A temporal clear-corneal main wound incision was created using a 2.417mmicrokeratome.  A continuous curvilinear capsulorrhexis was initiated using an irrigating cystitome and completed using capsulorrhexis forceps.  Hydrodissection and hydrodeliniation were performed.  Viscoelastic was injected into the anterior chamber.  A phacoemulsification handpiece and a chopper as a second instrument were used to remove the nucleus and epinucleus. The irrigation/aspiration handpiece was used to remove any  remaining cortical material.   The capsular bag was reinflated with viscoelastic, checked, and found to be intact.  The intraocular lens was inserted into the capsular bag.  The irrigation/aspiration handpiece was used to remove any remaining viscoelastic.  The clear corneal wound and paracentesis wounds were then hydrated and checked with Weck-Cels to be watertight. 0.67m23mf moxifloxacin was injected into the anterior chamber. The lid-speculum was removed.  The drape was removed.  The patient's face was cleaned with a wet and dry 4x4. A clear shield was taped over the eye. The patient was taken to the post-operative care unit in good condition, having tolerated the procedure well.  Post-Op Instructions: [facility]operative evaluation and will receive all other orders and instructions.

## 2022-05-21 NOTE — Anesthesia Preprocedure Evaluation (Signed)
Anesthesia Evaluation  Patient identified by MRN, date of birth, ID band Patient awake    Reviewed: Allergy & Precautions, H&P , NPO status , Patient's Chart, lab work & pertinent test results, reviewed documented beta blocker date and time   Airway Mallampati: II  TM Distance: >3 FB Neck ROM: full    Dental no notable dental hx.    Pulmonary neg pulmonary ROS   Pulmonary exam normal breath sounds clear to auscultation       Cardiovascular Exercise Tolerance: Good hypertension, negative cardio ROS + Valvular Problems/Murmurs  Rhythm:regular Rate:Normal     Neuro/Psych negative neurological ROS  negative psych ROS   GI/Hepatic negative GI ROS, Neg liver ROS,GERD  ,,  Endo/Other  negative endocrine ROSdiabetes, Type 2    Renal/GU negative Renal ROS  negative genitourinary   Musculoskeletal   Abdominal   Peds  Hematology negative hematology ROS (+) Blood dyscrasia, anemia   Anesthesia Other Findings   Reproductive/Obstetrics negative OB ROS                             Anesthesia Physical Anesthesia Plan  ASA: 3  Anesthesia Plan: MAC   Post-op Pain Management:    Induction:   PONV Risk Score and Plan:   Airway Management Planned:   Additional Equipment:   Intra-op Plan:   Post-operative Plan:   Informed Consent: I have reviewed the patients History and Physical, chart, labs and discussed the procedure including the risks, benefits and alternatives for the proposed anesthesia with the patient or authorized representative who has indicated his/her understanding and acceptance.     Dental Advisory Given  Plan Discussed with: CRNA  Anesthesia Plan Comments:        Anesthesia Quick Evaluation

## 2022-05-22 NOTE — Anesthesia Postprocedure Evaluation (Signed)
Anesthesia Post Note  Patient: Mary Holden  Procedure(s) Performed: CATARACT EXTRACTION PHACO AND INTRAOCULAR LENS PLACEMENT (IOC) (Right: Eye)  Patient location during evaluation: Phase II Anesthesia Type: MAC Level of consciousness: awake Pain management: pain level controlled Vital Signs Assessment: post-procedure vital signs reviewed and stable Respiratory status: spontaneous breathing and respiratory function stable Cardiovascular status: blood pressure returned to baseline and stable Postop Assessment: no headache and no apparent nausea or vomiting Anesthetic complications: no Comments: Late entry   No notable events documented.   Last Vitals:  Vitals:   05/21/22 0915 05/21/22 1002  BP:  (!) 130/53  Pulse: 74 78  Resp: 12 16  Temp:  36.8 C  SpO2: 99% 100%    Last Pain:  Vitals:   05/21/22 1002  TempSrc: Oral  PainSc: 0-No pain                 Louann Sjogren

## 2022-05-23 ENCOUNTER — Encounter (HOSPITAL_COMMUNITY): Payer: Self-pay | Admitting: Ophthalmology

## 2022-05-28 ENCOUNTER — Encounter (HOSPITAL_COMMUNITY)
Admission: RE | Admit: 2022-05-28 | Discharge: 2022-05-28 | Disposition: A | Discharge status: Home / Self Care | Payer: No Typology Code available for payment source | Source: Ambulatory Visit | Attending: Ophthalmology | Admitting: Ophthalmology

## 2022-05-28 DIAGNOSIS — H25812 Combined forms of age-related cataract, left eye: Secondary | ICD-10-CM | POA: Diagnosis not present

## 2022-05-31 NOTE — H&P (Signed)
Surgical History & Physical  Patient Name: Mary Holden DOB: 12-11-1955  Surgery: Cataract extraction with intraocular lens implant phacoemulsification; Left Eye  Surgeon: Baruch Goldmann MD Surgery Date:  06-04-22 Pre-Op Date:  05-24-22  HPI: A 1 Yr. old female patient 1. The patient is returning after cataract post-op. The right eye is affected. Status post cataract post-op, which began 1 week ago: Since the last visit, the affected area is doing well. The patient's vision is stable. Patient is following medication instructions. The patient complains of difficulty when viewing TV, reading closed caption, news scrolls on TV/nighttime driving, which began 1 year ago. Left eye is affected The episode is gradual. The condition's severity increased since last visit. Symptoms occur when the patient is driving, inside, outside and reading. The complaint is associated with glare. This is negatively affecting the patient's quality of life and the patient is unable to function adequately in life with the current level of vision. HPI was performed by Baruch Goldmann .  Medical History: Glaucoma Cataracts Diabetes High Blood Pressure LDL  Review of Systems Respiratory Asthma All recorded systems are negative except as noted above.  Social   Never smoked   Medication Prednisolone-Moxifloxacin-Bromfenac,  Lisinopril, Simvastatin, Singulair, Janumet, Furosemide, Montelukast, Glipizide, Aspirin, Zyrtec, Nexium,   Sx/Procedures Phaco c IOL OD,  Tonsilectomy, Lumpectomy (Breast) R,   Drug Allergies  Codeine,   History & Physical: Heent: cataract, left eye NECK: supple without bruits LUNGS: lungs clear to auscultation CV: regular rate and rhythm Abdomen: soft and non-tender Impression & Plan: Assessment: 1.  CATARACT EXTRACTION STATUS; Right Eye (Z98.41) 2.  COMBINED FORMS AGE RELATED CATARACT; Left Eye (H25.812)  Plan: 1.  4 daysafter cataract surgery. Doing well with improved vision  and normal eye pressure. Call with any problems or concerns. Continue Pred-Moxi-Brom 3x/day for 3 more days, then 2x/day for 3 more weeks.  2.  Cataract accounts for the patient's decreased vision. This visual impairment is not correctable with a tolerable change in glasses or contact lenses. Cataract surgery with an implantation of a new lens should significantly improve the visual and functional status of the patient. Discussed all risks, benefits, alternatives, and potential complications. Discussed the procedures and recovery. Patient desires to have surgery. A-scan ordered and performed today for intra-ocular lens calculations. The surgery will be performed in order to improve vision for driving, reading, and for eye examinations. Recommend phacoemulsification with intra-ocular lens. Recommend Dextenza for post-operative pain and inflammation. Left Eye. Surgery required to correct imbalance of vision. Dilates well - shugarcaine by protocol.

## 2022-06-04 ENCOUNTER — Other Ambulatory Visit: Payer: Self-pay

## 2022-06-04 ENCOUNTER — Ambulatory Visit (HOSPITAL_COMMUNITY)
Admission: RE | Admit: 2022-06-04 | Discharge: 2022-06-04 | Disposition: A | Discharge status: Home / Self Care | Payer: No Typology Code available for payment source | Source: Ambulatory Visit | Attending: Ophthalmology | Admitting: Ophthalmology

## 2022-06-04 ENCOUNTER — Encounter (HOSPITAL_COMMUNITY): Payer: Self-pay | Admitting: Ophthalmology

## 2022-06-04 ENCOUNTER — Encounter (HOSPITAL_COMMUNITY)
Admission: RE | Disposition: A | Discharge status: Home / Self Care | Payer: Self-pay | Source: Ambulatory Visit | Attending: Ophthalmology

## 2022-06-04 ENCOUNTER — Ambulatory Visit (HOSPITAL_COMMUNITY): Payer: No Typology Code available for payment source | Admitting: Anesthesiology

## 2022-06-04 DIAGNOSIS — E119 Type 2 diabetes mellitus without complications: Secondary | ICD-10-CM | POA: Diagnosis not present

## 2022-06-04 DIAGNOSIS — H25812 Combined forms of age-related cataract, left eye: Secondary | ICD-10-CM | POA: Diagnosis not present

## 2022-06-04 DIAGNOSIS — Z7984 Long term (current) use of oral hypoglycemic drugs: Secondary | ICD-10-CM | POA: Diagnosis not present

## 2022-06-04 DIAGNOSIS — K219 Gastro-esophageal reflux disease without esophagitis: Secondary | ICD-10-CM | POA: Insufficient documentation

## 2022-06-04 DIAGNOSIS — E1136 Type 2 diabetes mellitus with diabetic cataract: Secondary | ICD-10-CM | POA: Diagnosis not present

## 2022-06-04 DIAGNOSIS — Z9841 Cataract extraction status, right eye: Secondary | ICD-10-CM | POA: Diagnosis not present

## 2022-06-04 DIAGNOSIS — I10 Essential (primary) hypertension: Secondary | ICD-10-CM | POA: Insufficient documentation

## 2022-06-04 HISTORY — PX: CATARACT EXTRACTION W/PHACO: SHX586

## 2022-06-04 LAB — GLUCOSE, CAPILLARY: Glucose-Capillary: 124 mg/dL — ABNORMAL HIGH (ref 70–99)

## 2022-06-04 SURGERY — PHACOEMULSIFICATION, CATARACT, WITH IOL INSERTION
Anesthesia: Monitor Anesthesia Care | Site: Eye | Laterality: Left

## 2022-06-04 MED ORDER — PHENYLEPHRINE HCL 2.5 % OP SOLN
1.0000 [drp] | OPHTHALMIC | Status: AC | PRN
  Administered 2022-06-04 (×3): 1 [drp] via OPHTHALMIC

## 2022-06-04 MED ORDER — MOXIFLOXACIN HCL 5 MG/ML IO SOLN
INTRAOCULAR | Status: AC
  Filled 2022-06-04: qty 1

## 2022-06-04 MED ORDER — STERILE WATER FOR IRRIGATION IR SOLN
Status: DC | PRN
  Administered 2022-06-04: 25 mL

## 2022-06-04 MED ORDER — SODIUM HYALURONATE 10 MG/ML IO SOLUTION
PREFILLED_SYRINGE | INTRAOCULAR | Status: DC | PRN
  Administered 2022-06-04: .85 mL via INTRAOCULAR

## 2022-06-04 MED ORDER — SODIUM HYALURONATE 23MG/ML IO SOSY
PREFILLED_SYRINGE | INTRAOCULAR | Status: DC | PRN
  Administered 2022-06-04: .6 mL via INTRAOCULAR

## 2022-06-04 MED ORDER — MOXIFLOXACIN HCL 0.5 % OP SOLN
OPHTHALMIC | Status: DC | PRN
  Administered 2022-06-04: .3 mL via OPHTHALMIC

## 2022-06-04 MED ORDER — BSS IO SOLN
INTRAOCULAR | Status: DC | PRN
  Administered 2022-06-04: 15 mL via INTRAOCULAR

## 2022-06-04 MED ORDER — EPINEPHRINE PF 1 MG/ML IJ SOLN
INTRAOCULAR | Status: DC | PRN
  Administered 2022-06-04: 500 mL

## 2022-06-04 MED ORDER — TROPICAMIDE 1 % OP SOLN
1.0000 [drp] | OPHTHALMIC | Status: AC | PRN
  Administered 2022-06-04 (×3): 1 [drp] via OPHTHALMIC

## 2022-06-04 MED ORDER — LIDOCAINE HCL 3.5 % OP GEL
1.0000 | Freq: Once | OPHTHALMIC | Status: AC
  Administered 2022-06-04: 1 via OPHTHALMIC

## 2022-06-04 MED ORDER — MIDAZOLAM HCL 2 MG/2ML IJ SOLN
INTRAMUSCULAR | Status: AC
  Filled 2022-06-04: qty 2

## 2022-06-04 MED ORDER — LIDOCAINE HCL (PF) 1 % IJ SOLN
INTRAOCULAR | Status: DC | PRN
  Administered 2022-06-04: 1 mL via OPHTHALMIC

## 2022-06-04 MED ORDER — LACTATED RINGERS IV SOLN: INTRAVENOUS | Status: DC

## 2022-06-04 MED ORDER — EPINEPHRINE PF 1 MG/ML IJ SOLN
INTRAMUSCULAR | Status: AC
  Filled 2022-06-04: qty 2

## 2022-06-04 MED ORDER — POVIDONE-IODINE 5 % OP SOLN
OPHTHALMIC | Status: DC | PRN
  Administered 2022-06-04: 1 via OPHTHALMIC

## 2022-06-04 MED ORDER — TETRACAINE HCL 0.5 % OP SOLN
1.0000 [drp] | OPHTHALMIC | Status: AC | PRN
  Administered 2022-06-04 (×3): 1 [drp] via OPHTHALMIC

## 2022-06-04 SURGICAL SUPPLY — 15 items
CATARACT SUITE SIGHTPATH (MISCELLANEOUS) ×1 IMPLANT
CLOTH BEACON ORANGE TIMEOUT ST (SAFETY) ×2 IMPLANT
EYE SHIELD UNIVERSAL CLEAR (GAUZE/BANDAGES/DRESSINGS) IMPLANT
FEE CATARACT SUITE SIGHTPATH (MISCELLANEOUS) ×2 IMPLANT
GLOVE BIOGEL PI IND STRL 6.5 (GLOVE) IMPLANT
GLOVE BIOGEL PI IND STRL 7.0 (GLOVE) ×4 IMPLANT
LENS IOL RAYNER 17.0 (Intraocular Lens) ×1 IMPLANT
LENS IOL RAYONE EMV 17.0 (Intraocular Lens) IMPLANT
NDL HYPO 18GX1.5 BLUNT FILL (NEEDLE) ×2 IMPLANT
NEEDLE HYPO 18GX1.5 BLUNT FILL (NEEDLE) ×1 IMPLANT
PAD ARMBOARD 7.5X6 YLW CONV (MISCELLANEOUS) ×2 IMPLANT
SYR TB 1ML LL NO SAFETY (SYRINGE) ×2 IMPLANT
TAPE SURG TRANSPORE 1 IN (GAUZE/BANDAGES/DRESSINGS) IMPLANT
TAPE SURGICAL TRANSPORE 1 IN (GAUZE/BANDAGES/DRESSINGS) ×1
WATER STERILE IRR 250ML POUR (IV SOLUTION) ×2 IMPLANT

## 2022-06-04 NOTE — Anesthesia Preprocedure Evaluation (Signed)
Anesthesia Evaluation  Patient identified by MRN, date of birth, ID band Patient awake    Reviewed: Allergy & Precautions, H&P , NPO status , Patient's Chart, lab work & pertinent test results  Airway Mallampati: II  TM Distance: >3 FB Neck ROM: Full    Dental  (+) Dental Advisory Given, Teeth Intact   Pulmonary shortness of breath and with exertion   Pulmonary exam normal breath sounds clear to auscultation       Cardiovascular Exercise Tolerance: Good hypertension, Pt. on medications Normal cardiovascular exam+ Valvular Problems/Murmurs  Rhythm:Regular Rate:Normal     Neuro/Psych negative neurological ROS  negative psych ROS   GI/Hepatic Neg liver ROS,GERD  Medicated and Controlled,,  Endo/Other  diabetes, Well Controlled, Type 2, Oral Hypoglycemic Agents    Renal/GU negative Renal ROS  negative genitourinary   Musculoskeletal  (+) Arthritis , Osteoarthritis,    Abdominal   Peds negative pediatric ROS (+)  Hematology  (+) Blood dyscrasia, anemia   Anesthesia Other Findings   Reproductive/Obstetrics negative OB ROS                             Anesthesia Physical Anesthesia Plan  ASA: 2  Anesthesia Plan: MAC   Post-op Pain Management: Minimal or no pain anticipated   Induction:   PONV Risk Score and Plan: 0  Airway Management Planned:   Additional Equipment:   Intra-op Plan:   Post-operative Plan:   Informed Consent: I have reviewed the patients History and Physical, chart, labs and discussed the procedure including the risks, benefits and alternatives for the proposed anesthesia with the patient or authorized representative who has indicated his/her understanding and acceptance.     Dental advisory given  Plan Discussed with: CRNA and Surgeon  Anesthesia Plan Comments:         Anesthesia Quick Evaluation

## 2022-06-04 NOTE — Transfer of Care (Signed)
Immediate Anesthesia Transfer of Care Note  Patient: Mary Holden  Procedure(s) Performed: CATARACT EXTRACTION PHACO AND INTRAOCULAR LENS PLACEMENT (IOC) (Left: Eye)  Patient Location: PACU  Anesthesia Type:MAC  Level of Consciousness: awake  Airway & Oxygen Therapy: Patient Spontanous Breathing  Post-op Assessment: Report given to RN and Post -op Vital signs reviewed and stable  Post vital signs: Reviewed and stable  Last Vitals:  Vitals Value Taken Time  BP    Temp    Pulse    Resp    SpO2      Last Pain:  Vitals:   06/04/22 1056  TempSrc: Oral  PainSc: 0-No pain      Patients Stated Pain Goal: 6 (20/72/18 2883)  Complications: No notable events documented.

## 2022-06-04 NOTE — Discharge Instructions (Signed)
Please discharge patient when stable, will follow up today with Dr. Mazen Marcin at the [facility] office immediately following discharge.  Leave shield in place until visit.  All paperwork with discharge instructions will be given at the office.  [facility] Address:  [address]  [place], [place] [address]  

## 2022-06-04 NOTE — Anesthesia Postprocedure Evaluation (Signed)
Anesthesia Post Note  Patient: Mary Holden  Procedure(s) Performed: CATARACT EXTRACTION PHACO AND INTRAOCULAR LENS PLACEMENT (IOC) (Left: Eye)  Patient location during evaluation: Phase II Anesthesia Type: MAC Level of consciousness: awake and alert and oriented Pain management: pain level controlled Vital Signs Assessment: post-procedure vital signs reviewed and stable Respiratory status: spontaneous breathing, nonlabored ventilation and respiratory function stable Cardiovascular status: blood pressure returned to baseline and stable Postop Assessment: no apparent nausea or vomiting Anesthetic complications: no  No notable events documented.   Last Vitals:  Vitals:   06/04/22 1056 06/04/22 1217  BP: (!) 121/57 115/63  Pulse: 79 77  Resp: 16 16  Temp: 36.6 C 36.7 C  SpO2: 95% 98%    Last Pain:  Vitals:   06/04/22 1217  TempSrc: Oral  PainSc: 0-No pain                 Jamee Keach C Yen Wandell

## 2022-06-04 NOTE — Op Note (Signed)
Date of procedure: 06/04/22  Pre-operative diagnosis: Visually significant age-related combined cataract, Left Eye (H25.812)  Post-operative diagnosis: Visually significant age-related combined cataract, Left Eye (H25.812)  Procedure: Removal of cataract via phacoemulsification and insertion of intra-ocular lens Rayner [id number] +17.0D into the capsular bag of the Left Eye  Attending surgeon: Gerda Diss. Khushi Zupko, MD, MA  Anesthesia: MAC, Topical Akten  Complications: None  Estimated Blood Loss: <59m (minimal)  Specimens: None  Implants: As above  Indications:  Visually significant age-related cataract, Left Eye  Procedure:  The patient was seen and identified in the pre-operative area. The operative eye was identified and dilated.  The operative eye was marked.  Topical anesthesia was administered to the operative eye.     The patient was then to the operative suite and placed in the supine position.  A timeout was performed confirming the patient, procedure to be performed, and all other relevant information.   The patient's face was prepped and draped in the usual fashion for intra-ocular surgery.  A lid speculum was placed into the operative eye and the surgical microscope moved into place and focused.  An inferotemporal paracentesis was created using a 20 gauge paracentesis blade.  Shugarcaine was injected into the anterior chamber.  Viscoelastic was injected into the anterior chamber.  A temporal clear-corneal main wound incision was created using a 2.460mmicrokeratome.  A continuous curvilinear capsulorrhexis was initiated using an irrigating cystitome and completed using capsulorrhexis forceps.  Hydrodissection and hydrodeliniation were performed.  Viscoelastic was injected into the anterior chamber.  A phacoemulsification handpiece and a chopper as a second instrument were used to remove the nucleus and epinucleus. The irrigation/aspiration handpiece was used to remove any remaining  cortical material.   The capsular bag was reinflated with viscoelastic, checked, and found to be intact.  The intraocular lens was inserted into the capsular bag.  The irrigation/aspiration handpiece was used to remove any remaining viscoelastic.  The clear corneal wound and paracentesis wounds were then hydrated and checked with Weck-Cels to be watertight. [id number] moxifloxacin was injected into the anterior chamber. The lid-speculum was removed.  The drape was removed.  The patient's face was cleaned with a wet and dry 4x4.    A clear shield was taped over the eye. The patient was taken to the post-operative care unit in good condition, having tolerated the procedure well.  Post-Op Instructions: The patient will follow up at [facility] a same day post-operative evaluation and will receive all other orders and instructions.

## 2022-06-04 NOTE — Interval H&P Note (Signed)
History and Physical Interval Note:  06/04/2022 11:27 AM  Mary Holden  has presented today for surgery, with the diagnosis of combined forms age related cataract; left.  The various methods of treatment have been discussed with the patient and family. After consideration of risks, benefits and other options for treatment, the patient has consented to  Procedure(s) with comments: CATARACT EXTRACTION PHACO AND INTRAOCULAR LENS PLACEMENT (IOC) (Left) - CDE: as a surgical intervention.  The patient's history has been reviewed, patient examined, no change in status, stable for surgery.  I have reviewed the patient's chart and labs.  Questions were answered to the patient's satisfaction.     Baruch Goldmann

## 2022-06-08 ENCOUNTER — Encounter (HOSPITAL_COMMUNITY): Payer: Self-pay | Admitting: Ophthalmology

## 2022-07-05 DIAGNOSIS — D509 Iron deficiency anemia, unspecified: Secondary | ICD-10-CM | POA: Diagnosis not present

## 2022-07-31 DIAGNOSIS — H52221 Regular astigmatism, right eye: Secondary | ICD-10-CM | POA: Diagnosis not present

## 2022-07-31 DIAGNOSIS — Z0100 Encounter for examination of eyes and vision without abnormal findings: Secondary | ICD-10-CM | POA: Diagnosis not present

## 2022-08-06 ENCOUNTER — Ambulatory Visit (HOSPITAL_COMMUNITY)
Admission: RE | Admit: 2022-08-06 | Discharge: 2022-08-06 | Disposition: A | Discharge status: Home / Self Care | Payer: No Typology Code available for payment source | Source: Ambulatory Visit | Attending: Internal Medicine | Admitting: Internal Medicine

## 2022-08-06 ENCOUNTER — Encounter (HOSPITAL_COMMUNITY): Payer: Self-pay

## 2022-08-06 DIAGNOSIS — Z1231 Encounter for screening mammogram for malignant neoplasm of breast: Secondary | ICD-10-CM | POA: Diagnosis not present

## 2022-08-28 DIAGNOSIS — E782 Mixed hyperlipidemia: Secondary | ICD-10-CM | POA: Diagnosis not present

## 2022-08-28 DIAGNOSIS — D509 Iron deficiency anemia, unspecified: Secondary | ICD-10-CM | POA: Diagnosis not present

## 2022-08-28 DIAGNOSIS — E1169 Type 2 diabetes mellitus with other specified complication: Secondary | ICD-10-CM | POA: Diagnosis not present

## 2022-09-03 DIAGNOSIS — Z0000 Encounter for general adult medical examination without abnormal findings: Secondary | ICD-10-CM | POA: Diagnosis not present

## 2022-09-04 DIAGNOSIS — R944 Abnormal results of kidney function studies: Secondary | ICD-10-CM | POA: Diagnosis not present

## 2022-09-04 DIAGNOSIS — E875 Hyperkalemia: Secondary | ICD-10-CM | POA: Diagnosis not present

## 2022-09-04 DIAGNOSIS — M25511 Pain in right shoulder: Secondary | ICD-10-CM | POA: Diagnosis not present

## 2022-09-04 DIAGNOSIS — E782 Mixed hyperlipidemia: Secondary | ICD-10-CM | POA: Diagnosis not present

## 2022-09-04 DIAGNOSIS — E1169 Type 2 diabetes mellitus with other specified complication: Secondary | ICD-10-CM | POA: Diagnosis not present

## 2022-09-04 DIAGNOSIS — I10 Essential (primary) hypertension: Secondary | ICD-10-CM | POA: Diagnosis not present

## 2022-09-04 DIAGNOSIS — K760 Fatty (change of) liver, not elsewhere classified: Secondary | ICD-10-CM | POA: Diagnosis not present

## 2022-09-04 DIAGNOSIS — E1159 Type 2 diabetes mellitus with other circulatory complications: Secondary | ICD-10-CM | POA: Diagnosis not present

## 2022-09-04 DIAGNOSIS — M79604 Pain in right leg: Secondary | ICD-10-CM | POA: Diagnosis not present

## 2022-09-04 DIAGNOSIS — D509 Iron deficiency anemia, unspecified: Secondary | ICD-10-CM | POA: Diagnosis not present

## 2022-09-04 DIAGNOSIS — N3281 Overactive bladder: Secondary | ICD-10-CM | POA: Diagnosis not present

## 2022-09-04 DIAGNOSIS — R809 Proteinuria, unspecified: Secondary | ICD-10-CM | POA: Diagnosis not present

## 2022-09-17 DIAGNOSIS — M25511 Pain in right shoulder: Secondary | ICD-10-CM | POA: Diagnosis not present

## 2022-10-11 IMAGING — MG MM DIGITAL SCREENING BILAT W/ TOMO AND CAD
6 of 12 series · 6 of 36 positions shown · non-contrast
Comparison: Previous exam(s).

CLINICAL DATA: Screening.

EXAM:
DIGITAL SCREENING BILATERAL MAMMOGRAM WITH TOMOSYNTHESIS AND CAD
TECHNIQUE: Bilateral screening digital craniocaudal and mediolateral oblique
mammograms were obtained. Bilateral screening digital breast
tomosynthesis was performed. The images were evaluated with
computer-aided detection.

[L MLO synth-2D (1 of 2)]
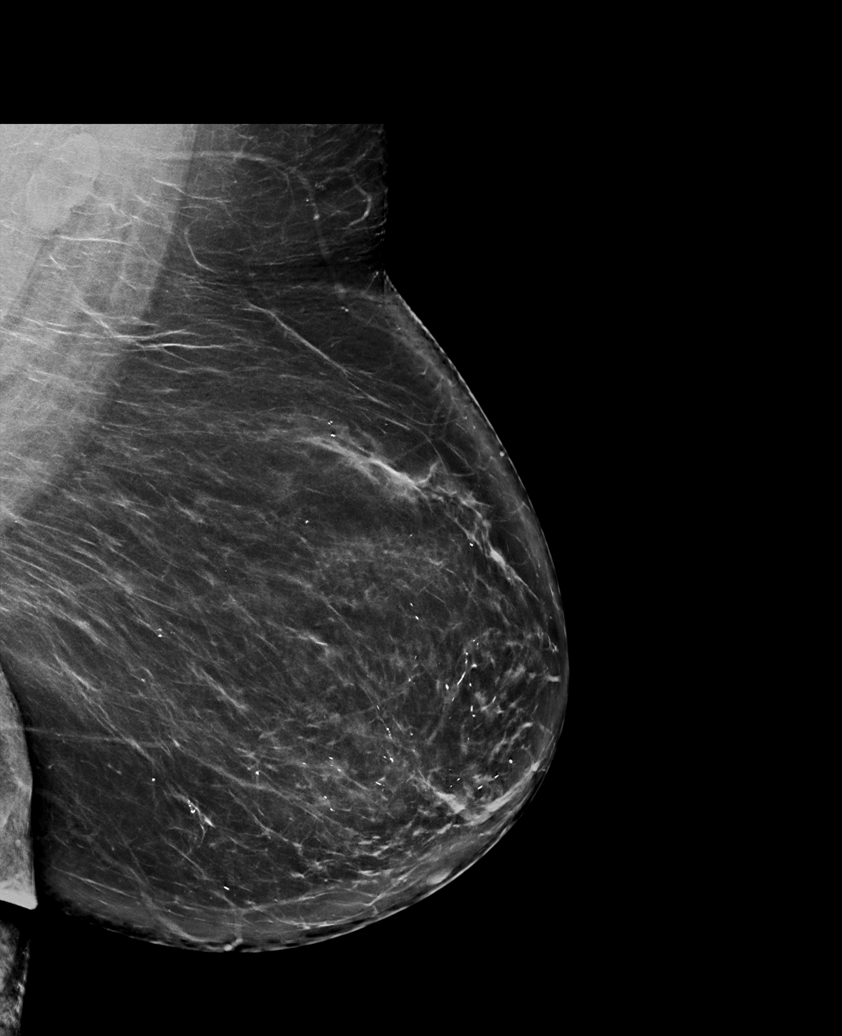

[R CC synth-2D]
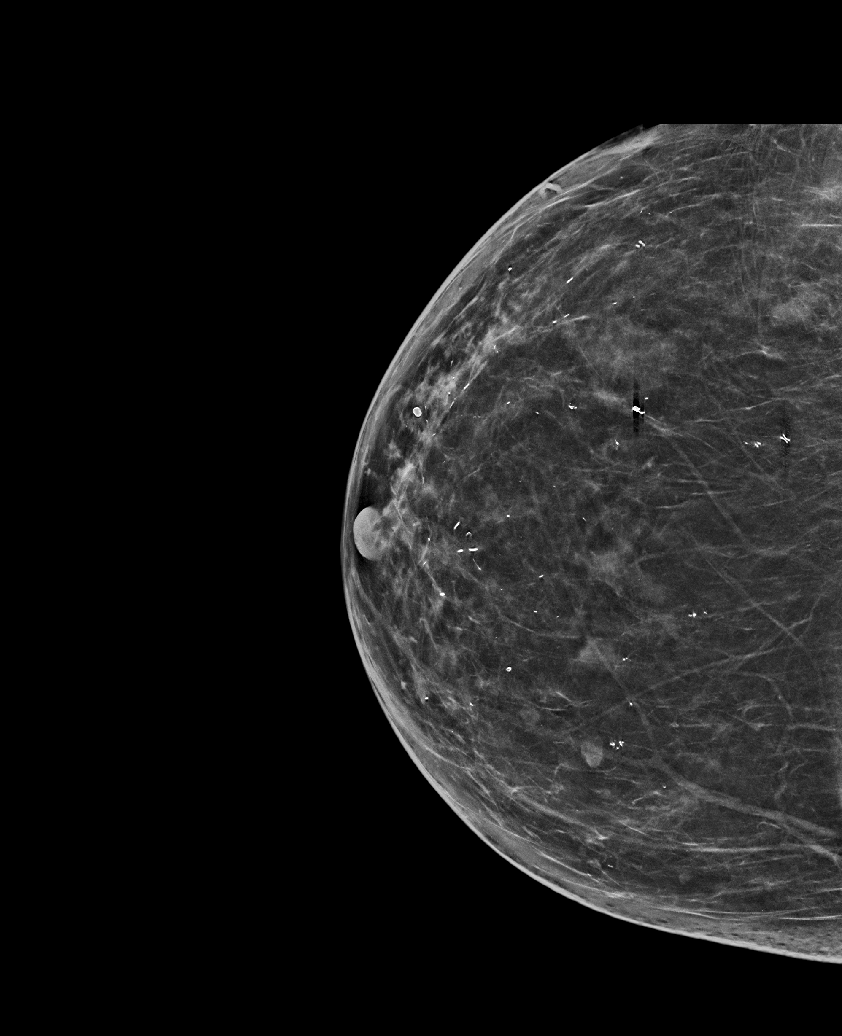

[R MLO synth-2D (1 of 2)]
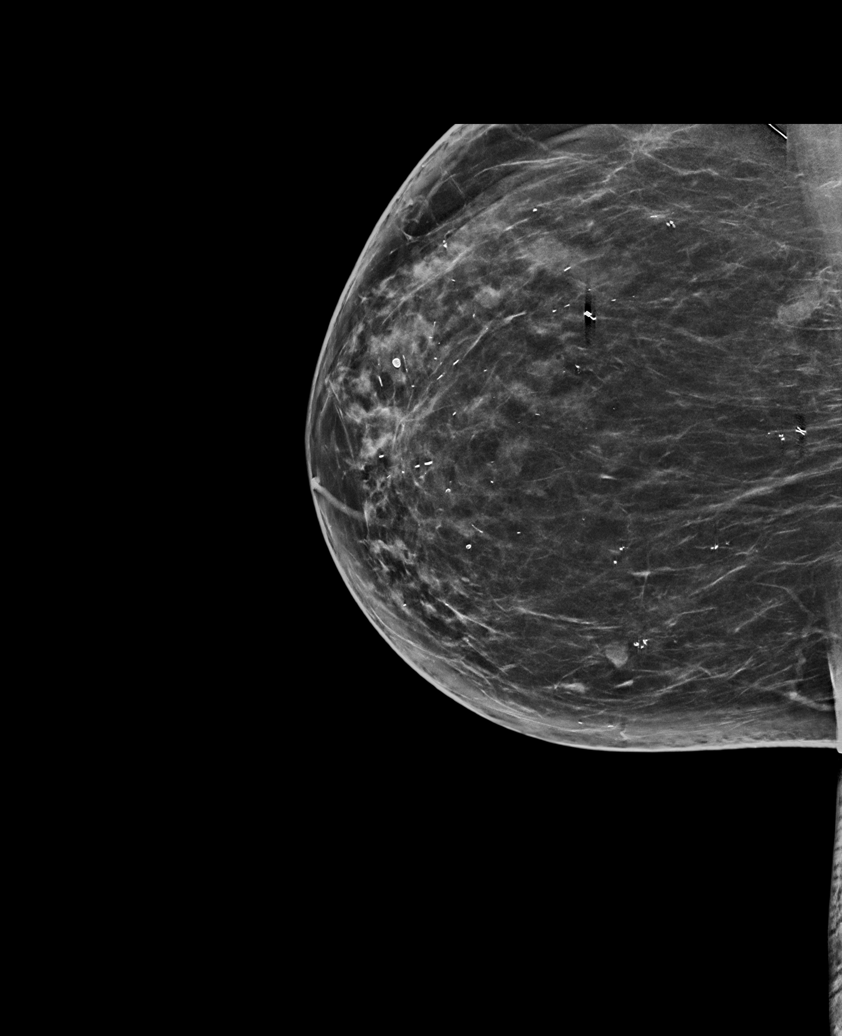

[R MLO synth-2D (2 of 2)]
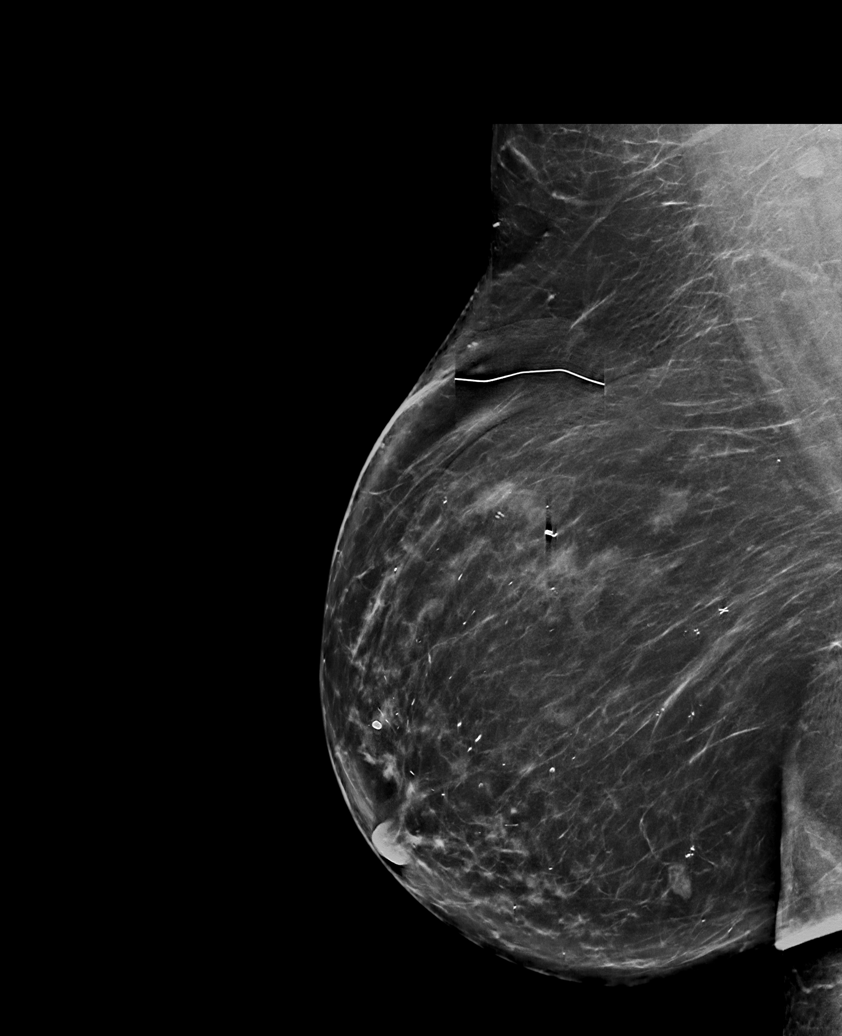

[L MLO synth-2D (2 of 2)]
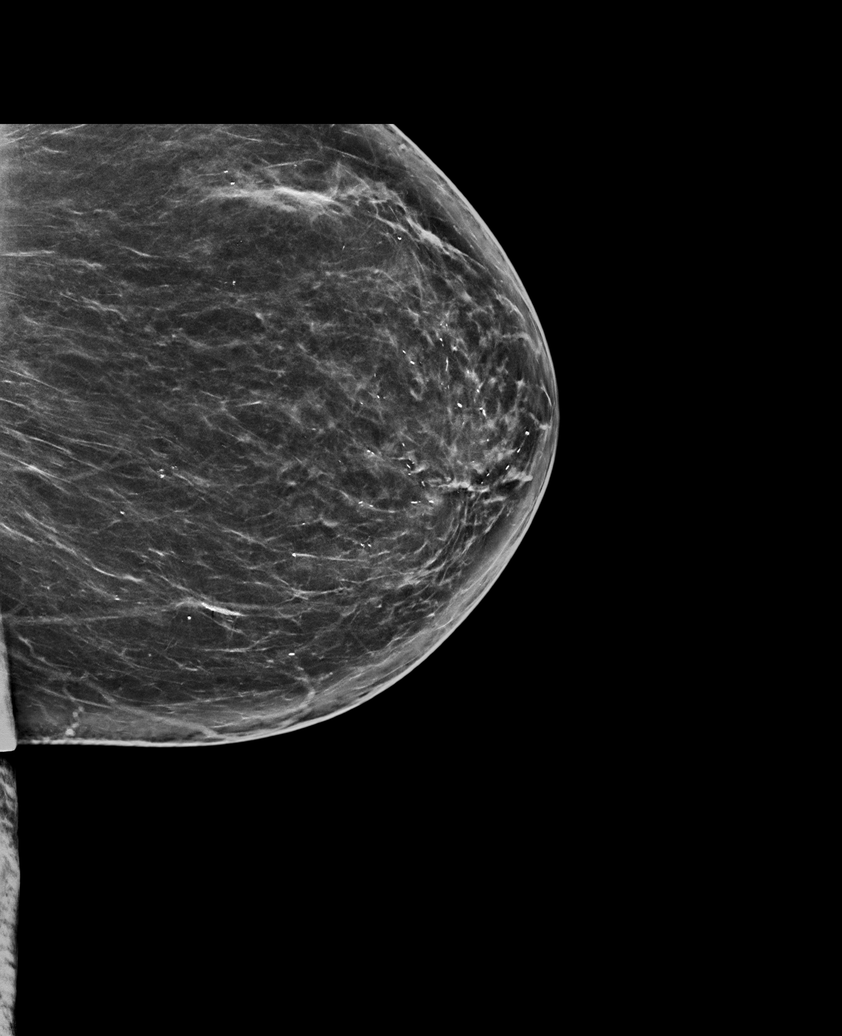

[L CC synth-2D]
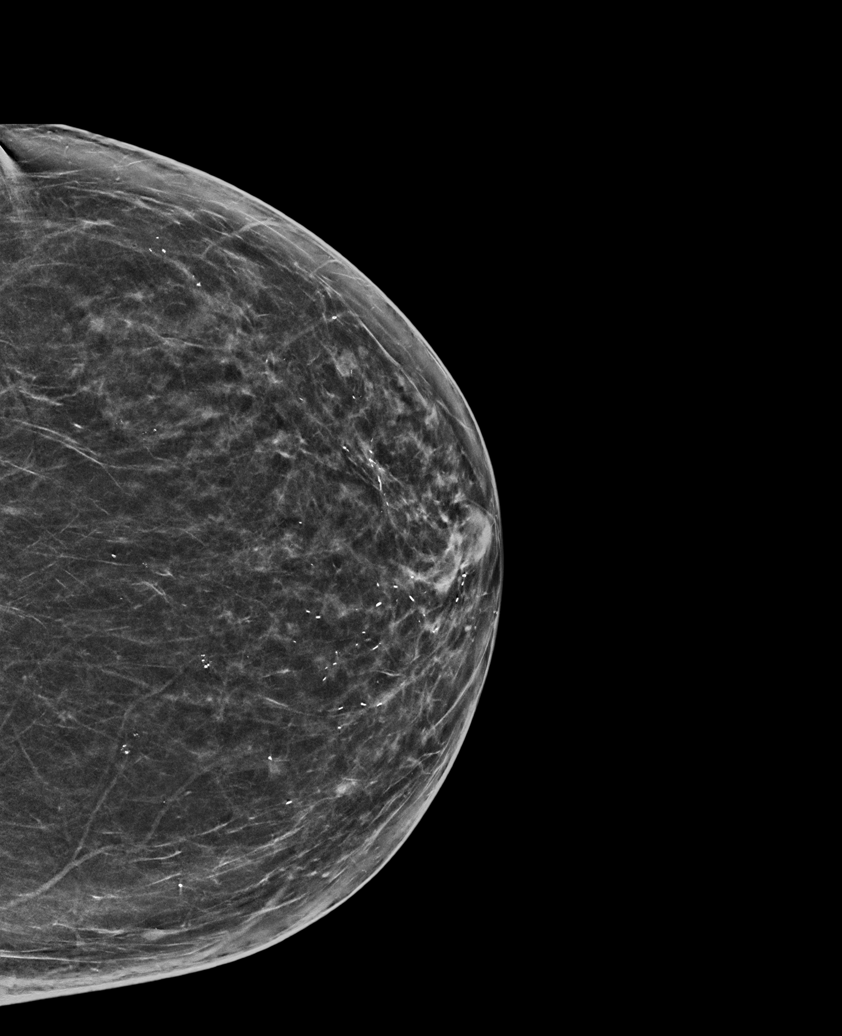

[6 of 36 positions shown; findings below may reference images not displayed]

ACR Breast Density Category b: There are scattered areas of
fibroglandular density.
FINDINGS: There are no findings suspicious for malignancy. The images were
evaluated with computer-aided detection.
IMPRESSION: No mammographic evidence of malignancy. A result letter of this
screening mammogram will be mailed directly to the patient.

RECOMMENDATION:
Screening mammogram in one year. (Code:[id number])

BI-RADS CATEGORY  1: Negative.

## 2022-10-29 DIAGNOSIS — M25511 Pain in right shoulder: Secondary | ICD-10-CM | POA: Diagnosis not present

## 2023-01-01 DIAGNOSIS — E782 Mixed hyperlipidemia: Secondary | ICD-10-CM | POA: Diagnosis not present

## 2023-01-01 DIAGNOSIS — E1169 Type 2 diabetes mellitus with other specified complication: Secondary | ICD-10-CM | POA: Diagnosis not present

## 2023-01-01 DIAGNOSIS — D509 Iron deficiency anemia, unspecified: Secondary | ICD-10-CM | POA: Diagnosis not present

## 2023-01-04 DIAGNOSIS — R809 Proteinuria, unspecified: Secondary | ICD-10-CM | POA: Diagnosis not present

## 2023-01-04 DIAGNOSIS — E875 Hyperkalemia: Secondary | ICD-10-CM | POA: Diagnosis not present

## 2023-01-04 DIAGNOSIS — D509 Iron deficiency anemia, unspecified: Secondary | ICD-10-CM | POA: Diagnosis not present

## 2023-01-04 DIAGNOSIS — K760 Fatty (change of) liver, not elsewhere classified: Secondary | ICD-10-CM | POA: Diagnosis not present

## 2023-01-04 DIAGNOSIS — M75101 Unspecified rotator cuff tear or rupture of right shoulder, not specified as traumatic: Secondary | ICD-10-CM | POA: Diagnosis not present

## 2023-01-04 DIAGNOSIS — R944 Abnormal results of kidney function studies: Secondary | ICD-10-CM | POA: Diagnosis not present

## 2023-01-04 DIAGNOSIS — R040 Epistaxis: Secondary | ICD-10-CM | POA: Diagnosis not present

## 2023-01-04 DIAGNOSIS — N3281 Overactive bladder: Secondary | ICD-10-CM | POA: Diagnosis not present

## 2023-01-04 DIAGNOSIS — I10 Essential (primary) hypertension: Secondary | ICD-10-CM | POA: Diagnosis not present

## 2023-01-04 DIAGNOSIS — E1169 Type 2 diabetes mellitus with other specified complication: Secondary | ICD-10-CM | POA: Diagnosis not present

## 2023-01-04 DIAGNOSIS — E782 Mixed hyperlipidemia: Secondary | ICD-10-CM | POA: Diagnosis not present

## 2023-01-04 DIAGNOSIS — M79604 Pain in right leg: Secondary | ICD-10-CM | POA: Diagnosis not present

## 2023-04-30 DIAGNOSIS — E1169 Type 2 diabetes mellitus with other specified complication: Secondary | ICD-10-CM | POA: Diagnosis not present

## 2023-04-30 DIAGNOSIS — D509 Iron deficiency anemia, unspecified: Secondary | ICD-10-CM | POA: Diagnosis not present

## 2023-04-30 DIAGNOSIS — E782 Mixed hyperlipidemia: Secondary | ICD-10-CM | POA: Diagnosis not present

## 2023-05-06 DIAGNOSIS — E1169 Type 2 diabetes mellitus with other specified complication: Secondary | ICD-10-CM | POA: Diagnosis not present

## 2023-05-06 DIAGNOSIS — M75101 Unspecified rotator cuff tear or rupture of right shoulder, not specified as traumatic: Secondary | ICD-10-CM | POA: Diagnosis not present

## 2023-05-06 DIAGNOSIS — M79604 Pain in right leg: Secondary | ICD-10-CM | POA: Diagnosis not present

## 2023-05-06 DIAGNOSIS — N3281 Overactive bladder: Secondary | ICD-10-CM | POA: Diagnosis not present

## 2023-05-06 DIAGNOSIS — R040 Epistaxis: Secondary | ICD-10-CM | POA: Diagnosis not present

## 2023-05-06 DIAGNOSIS — E875 Hyperkalemia: Secondary | ICD-10-CM | POA: Diagnosis not present

## 2023-05-06 DIAGNOSIS — E782 Mixed hyperlipidemia: Secondary | ICD-10-CM | POA: Diagnosis not present

## 2023-05-06 DIAGNOSIS — K760 Fatty (change of) liver, not elsewhere classified: Secondary | ICD-10-CM | POA: Diagnosis not present

## 2023-05-06 DIAGNOSIS — R944 Abnormal results of kidney function studies: Secondary | ICD-10-CM | POA: Diagnosis not present

## 2023-05-06 DIAGNOSIS — D509 Iron deficiency anemia, unspecified: Secondary | ICD-10-CM | POA: Diagnosis not present

## 2023-05-06 DIAGNOSIS — R809 Proteinuria, unspecified: Secondary | ICD-10-CM | POA: Diagnosis not present

## 2023-05-06 DIAGNOSIS — I10 Essential (primary) hypertension: Secondary | ICD-10-CM | POA: Diagnosis not present

## 2023-05-13 ENCOUNTER — Other Ambulatory Visit (HOSPITAL_COMMUNITY): Payer: Self-pay | Admitting: Internal Medicine

## 2023-05-13 DIAGNOSIS — Z1231 Encounter for screening mammogram for malignant neoplasm of breast: Secondary | ICD-10-CM

## 2023-05-16 ENCOUNTER — Encounter (INDEPENDENT_AMBULATORY_CARE_PROVIDER_SITE_OTHER): Payer: Self-pay | Admitting: *Deleted

## 2023-05-23 ENCOUNTER — Encounter: Payer: Self-pay | Admitting: *Deleted

## 2023-06-05 DIAGNOSIS — R059 Cough, unspecified: Secondary | ICD-10-CM | POA: Diagnosis not present

## 2023-06-05 DIAGNOSIS — J069 Acute upper respiratory infection, unspecified: Secondary | ICD-10-CM | POA: Diagnosis not present

## 2023-08-08 ENCOUNTER — Ambulatory Visit (HOSPITAL_COMMUNITY): Payer: No Typology Code available for payment source

## 2023-08-09 ENCOUNTER — Ambulatory Visit (HOSPITAL_COMMUNITY)
Admission: RE | Admit: 2023-08-09 | Discharge: 2023-08-09 | Disposition: A | Discharge status: Home / Self Care | Source: Ambulatory Visit | Attending: Internal Medicine | Admitting: Internal Medicine

## 2023-08-09 DIAGNOSIS — Z1231 Encounter for screening mammogram for malignant neoplasm of breast: Secondary | ICD-10-CM | POA: Insufficient documentation

## 2023-08-29 DIAGNOSIS — D509 Iron deficiency anemia, unspecified: Secondary | ICD-10-CM | POA: Diagnosis not present

## 2023-08-29 DIAGNOSIS — E782 Mixed hyperlipidemia: Secondary | ICD-10-CM | POA: Diagnosis not present

## 2023-08-29 DIAGNOSIS — E1169 Type 2 diabetes mellitus with other specified complication: Secondary | ICD-10-CM | POA: Diagnosis not present

## 2023-09-04 DIAGNOSIS — E875 Hyperkalemia: Secondary | ICD-10-CM | POA: Diagnosis not present

## 2023-09-04 DIAGNOSIS — E1169 Type 2 diabetes mellitus with other specified complication: Secondary | ICD-10-CM | POA: Diagnosis not present

## 2023-09-04 DIAGNOSIS — E782 Mixed hyperlipidemia: Secondary | ICD-10-CM | POA: Diagnosis not present

## 2023-09-04 DIAGNOSIS — M79604 Pain in right leg: Secondary | ICD-10-CM | POA: Diagnosis not present

## 2023-09-04 DIAGNOSIS — R809 Proteinuria, unspecified: Secondary | ICD-10-CM | POA: Diagnosis not present

## 2023-09-04 DIAGNOSIS — I10 Essential (primary) hypertension: Secondary | ICD-10-CM | POA: Diagnosis not present

## 2023-09-04 DIAGNOSIS — N1831 Chronic kidney disease, stage 3a: Secondary | ICD-10-CM | POA: Diagnosis not present

## 2023-09-04 DIAGNOSIS — I129 Hypertensive chronic kidney disease with stage 1 through stage 4 chronic kidney disease, or unspecified chronic kidney disease: Secondary | ICD-10-CM | POA: Diagnosis not present

## 2023-09-04 DIAGNOSIS — D509 Iron deficiency anemia, unspecified: Secondary | ICD-10-CM | POA: Diagnosis not present

## 2023-09-04 DIAGNOSIS — R040 Epistaxis: Secondary | ICD-10-CM | POA: Diagnosis not present

## 2023-09-04 DIAGNOSIS — K760 Fatty (change of) liver, not elsewhere classified: Secondary | ICD-10-CM | POA: Diagnosis not present

## 2023-09-04 DIAGNOSIS — N3281 Overactive bladder: Secondary | ICD-10-CM | POA: Diagnosis not present

## 2023-11-13 ENCOUNTER — Encounter (INDEPENDENT_AMBULATORY_CARE_PROVIDER_SITE_OTHER): Payer: Self-pay | Admitting: *Deleted

## 2024-01-29 DIAGNOSIS — E1169 Type 2 diabetes mellitus with other specified complication: Secondary | ICD-10-CM | POA: Diagnosis not present

## 2024-01-29 DIAGNOSIS — E782 Mixed hyperlipidemia: Secondary | ICD-10-CM | POA: Diagnosis not present

## 2024-01-29 DIAGNOSIS — D509 Iron deficiency anemia, unspecified: Secondary | ICD-10-CM | POA: Diagnosis not present

## 2024-02-04 DIAGNOSIS — N1831 Chronic kidney disease, stage 3a: Secondary | ICD-10-CM | POA: Diagnosis not present

## 2024-02-04 DIAGNOSIS — E1169 Type 2 diabetes mellitus with other specified complication: Secondary | ICD-10-CM | POA: Diagnosis not present

## 2024-02-04 DIAGNOSIS — I10 Essential (primary) hypertension: Secondary | ICD-10-CM | POA: Diagnosis not present

## 2024-02-04 DIAGNOSIS — E875 Hyperkalemia: Secondary | ICD-10-CM | POA: Diagnosis not present

## 2024-02-04 DIAGNOSIS — E1165 Type 2 diabetes mellitus with hyperglycemia: Secondary | ICD-10-CM | POA: Diagnosis not present

## 2024-02-04 DIAGNOSIS — K760 Fatty (change of) liver, not elsewhere classified: Secondary | ICD-10-CM | POA: Diagnosis not present

## 2024-02-04 DIAGNOSIS — E162 Hypoglycemia, unspecified: Secondary | ICD-10-CM | POA: Diagnosis not present

## 2024-02-04 DIAGNOSIS — D509 Iron deficiency anemia, unspecified: Secondary | ICD-10-CM | POA: Diagnosis not present

## 2024-02-04 DIAGNOSIS — E1122 Type 2 diabetes mellitus with diabetic chronic kidney disease: Secondary | ICD-10-CM | POA: Diagnosis not present

## 2024-02-04 DIAGNOSIS — E11649 Type 2 diabetes mellitus with hypoglycemia without coma: Secondary | ICD-10-CM | POA: Diagnosis not present

## 2024-02-04 DIAGNOSIS — R809 Proteinuria, unspecified: Secondary | ICD-10-CM | POA: Diagnosis not present

## 2024-02-04 DIAGNOSIS — E782 Mixed hyperlipidemia: Secondary | ICD-10-CM | POA: Diagnosis not present

## 2024-02-11 ENCOUNTER — Encounter (INDEPENDENT_AMBULATORY_CARE_PROVIDER_SITE_OTHER): Payer: Self-pay | Admitting: *Deleted

## 2024-02-21 DIAGNOSIS — Z23 Encounter for immunization: Secondary | ICD-10-CM | POA: Diagnosis not present

## 2024-03-03 DIAGNOSIS — D509 Iron deficiency anemia, unspecified: Secondary | ICD-10-CM | POA: Diagnosis not present

## 2024-03-03 DIAGNOSIS — N1831 Chronic kidney disease, stage 3a: Secondary | ICD-10-CM | POA: Diagnosis not present

## 2024-03-06 DIAGNOSIS — I129 Hypertensive chronic kidney disease with stage 1 through stage 4 chronic kidney disease, or unspecified chronic kidney disease: Secondary | ICD-10-CM | POA: Diagnosis not present

## 2024-03-06 DIAGNOSIS — K760 Fatty (change of) liver, not elsewhere classified: Secondary | ICD-10-CM | POA: Diagnosis not present

## 2024-03-06 DIAGNOSIS — E875 Hyperkalemia: Secondary | ICD-10-CM | POA: Diagnosis not present

## 2024-03-06 DIAGNOSIS — M75101 Unspecified rotator cuff tear or rupture of right shoulder, not specified as traumatic: Secondary | ICD-10-CM | POA: Diagnosis not present

## 2024-03-06 DIAGNOSIS — R040 Epistaxis: Secondary | ICD-10-CM | POA: Diagnosis not present

## 2024-03-06 DIAGNOSIS — Z0001 Encounter for general adult medical examination with abnormal findings: Secondary | ICD-10-CM | POA: Diagnosis not present

## 2024-03-06 DIAGNOSIS — N1831 Chronic kidney disease, stage 3a: Secondary | ICD-10-CM | POA: Diagnosis not present

## 2024-03-06 DIAGNOSIS — R809 Proteinuria, unspecified: Secondary | ICD-10-CM | POA: Diagnosis not present

## 2024-03-06 DIAGNOSIS — N3281 Overactive bladder: Secondary | ICD-10-CM | POA: Diagnosis not present

## 2024-03-06 DIAGNOSIS — M79604 Pain in right leg: Secondary | ICD-10-CM | POA: Diagnosis not present

## 2024-03-06 DIAGNOSIS — Z0000 Encounter for general adult medical examination without abnormal findings: Secondary | ICD-10-CM | POA: Diagnosis not present

## 2024-03-06 DIAGNOSIS — E1169 Type 2 diabetes mellitus with other specified complication: Secondary | ICD-10-CM | POA: Diagnosis not present

## 2024-03-30 DIAGNOSIS — N1831 Chronic kidney disease, stage 3a: Secondary | ICD-10-CM | POA: Diagnosis not present

## 2024-03-30 DIAGNOSIS — D509 Iron deficiency anemia, unspecified: Secondary | ICD-10-CM | POA: Diagnosis not present

## 2024-04-01 ENCOUNTER — Encounter: Payer: Self-pay | Admitting: Gastroenterology

## 2024-04-03 DIAGNOSIS — E1122 Type 2 diabetes mellitus with diabetic chronic kidney disease: Secondary | ICD-10-CM | POA: Diagnosis not present

## 2024-04-03 DIAGNOSIS — D509 Iron deficiency anemia, unspecified: Secondary | ICD-10-CM | POA: Diagnosis not present

## 2024-04-03 DIAGNOSIS — E162 Hypoglycemia, unspecified: Secondary | ICD-10-CM | POA: Diagnosis not present

## 2024-04-03 DIAGNOSIS — I129 Hypertensive chronic kidney disease with stage 1 through stage 4 chronic kidney disease, or unspecified chronic kidney disease: Secondary | ICD-10-CM | POA: Diagnosis not present

## 2024-04-03 DIAGNOSIS — M79604 Pain in right leg: Secondary | ICD-10-CM | POA: Diagnosis not present

## 2024-04-03 DIAGNOSIS — K760 Fatty (change of) liver, not elsewhere classified: Secondary | ICD-10-CM | POA: Diagnosis not present

## 2024-04-03 DIAGNOSIS — N1831 Chronic kidney disease, stage 3a: Secondary | ICD-10-CM | POA: Diagnosis not present

## 2024-04-03 DIAGNOSIS — E1169 Type 2 diabetes mellitus with other specified complication: Secondary | ICD-10-CM | POA: Diagnosis not present

## 2024-04-03 DIAGNOSIS — N3281 Overactive bladder: Secondary | ICD-10-CM | POA: Diagnosis not present

## 2024-04-03 DIAGNOSIS — R809 Proteinuria, unspecified: Secondary | ICD-10-CM | POA: Diagnosis not present

## 2024-04-03 DIAGNOSIS — E875 Hyperkalemia: Secondary | ICD-10-CM | POA: Diagnosis not present

## 2024-04-03 DIAGNOSIS — R040 Epistaxis: Secondary | ICD-10-CM | POA: Diagnosis not present
